# Patient Record
Sex: Female | Born: 1960 | Race: White | Hispanic: No | Smoking: Never smoker
Health system: Southern US, Community
[De-identification: ages and names within clinical notes are randomized; demographics above are authoritative.]

## PROBLEM LIST (undated history)

## (undated) DIAGNOSIS — E119 Type 2 diabetes mellitus without complications: Secondary | ICD-10-CM

## (undated) DIAGNOSIS — K219 Gastro-esophageal reflux disease without esophagitis: Secondary | ICD-10-CM

## (undated) DIAGNOSIS — K649 Unspecified hemorrhoids: Secondary | ICD-10-CM

## (undated) DIAGNOSIS — K589 Irritable bowel syndrome without diarrhea: Secondary | ICD-10-CM

## (undated) DIAGNOSIS — F419 Anxiety disorder, unspecified: Secondary | ICD-10-CM

## (undated) DIAGNOSIS — T7840XA Allergy, unspecified, initial encounter: Secondary | ICD-10-CM

## (undated) DIAGNOSIS — D580 Hereditary spherocytosis: Secondary | ICD-10-CM

## (undated) DIAGNOSIS — K449 Diaphragmatic hernia without obstruction or gangrene: Secondary | ICD-10-CM

## (undated) HISTORY — DX: Irritable bowel syndrome, unspecified: K58.9

## (undated) HISTORY — DX: Type 2 diabetes mellitus without complications: E11.9

## (undated) HISTORY — PX: HERNIA REPAIR: SHX51

## (undated) HISTORY — DX: Gastro-esophageal reflux disease without esophagitis: K21.9

## (undated) HISTORY — DX: Unspecified hemorrhoids: K64.9

## (undated) HISTORY — DX: Diaphragmatic hernia without obstruction or gangrene: K44.9

## (undated) HISTORY — DX: Anxiety disorder, unspecified: F41.9

## (undated) HISTORY — PX: TUBAL LIGATION: SHX77

## (undated) HISTORY — DX: Hereditary spherocytosis: D58.0

## (undated) HISTORY — PX: COSMETIC SURGERY: SHX468

## (undated) HISTORY — DX: Allergy, unspecified, initial encounter: T78.40XA

---

## 1997-05-25 HISTORY — PX: TUBAL LIGATION: SHX77

## 1998-01-31 ENCOUNTER — Ambulatory Visit (HOSPITAL_COMMUNITY): Admission: RE | Admit: 1998-01-31 | Discharge: 1998-01-31 | Payer: Self-pay | Admitting: Obstetrics and Gynecology

## 1998-03-25 ENCOUNTER — Encounter: Payer: Self-pay | Admitting: *Deleted

## 1998-03-25 ENCOUNTER — Ambulatory Visit (HOSPITAL_COMMUNITY): Admission: RE | Admit: 1998-03-25 | Discharge: 1998-03-25 | Payer: Self-pay | Admitting: *Deleted

## 1998-04-23 ENCOUNTER — Inpatient Hospital Stay (HOSPITAL_COMMUNITY): Admission: AD | Admit: 1998-04-23 | Discharge: 1998-04-23 | Payer: Self-pay | Admitting: Obstetrics and Gynecology

## 1998-04-24 ENCOUNTER — Inpatient Hospital Stay (HOSPITAL_COMMUNITY): Admission: AD | Admit: 1998-04-24 | Discharge: 1998-04-24 | Payer: Self-pay | Admitting: Obstetrics and Gynecology

## 1998-04-25 ENCOUNTER — Inpatient Hospital Stay (HOSPITAL_COMMUNITY): Admission: AD | Admit: 1998-04-25 | Discharge: 1998-04-25 | Payer: Self-pay | Admitting: *Deleted

## 1998-05-03 ENCOUNTER — Inpatient Hospital Stay (HOSPITAL_COMMUNITY): Admission: RE | Admit: 1998-05-03 | Discharge: 1998-05-03 | Payer: Self-pay | Admitting: *Deleted

## 1998-05-07 ENCOUNTER — Encounter (HOSPITAL_COMMUNITY): Admission: RE | Admit: 1998-05-07 | Discharge: 1998-05-17 | Payer: Self-pay | Admitting: Obstetrics and Gynecology

## 1998-05-16 ENCOUNTER — Inpatient Hospital Stay (HOSPITAL_COMMUNITY): Admission: AD | Admit: 1998-05-16 | Discharge: 1998-05-19 | Payer: Self-pay | Admitting: Obstetrics and Gynecology

## 1998-05-20 ENCOUNTER — Encounter (HOSPITAL_COMMUNITY): Admission: RE | Admit: 1998-05-20 | Discharge: 1998-08-18 | Payer: Self-pay | Admitting: *Deleted

## 1999-08-20 ENCOUNTER — Other Ambulatory Visit: Admission: RE | Admit: 1999-08-20 | Discharge: 1999-08-20 | Payer: Self-pay | Admitting: Family Medicine

## 1999-09-11 ENCOUNTER — Encounter: Admission: RE | Admit: 1999-09-11 | Discharge: 1999-09-11 | Payer: Self-pay | Admitting: Family Medicine

## 1999-09-11 ENCOUNTER — Encounter: Payer: Self-pay | Admitting: Family Medicine

## 2000-02-13 ENCOUNTER — Encounter: Payer: Self-pay | Admitting: Family Medicine

## 2000-02-13 ENCOUNTER — Encounter: Admission: RE | Admit: 2000-02-13 | Discharge: 2000-02-13 | Payer: Self-pay | Admitting: Family Medicine

## 2000-12-15 ENCOUNTER — Other Ambulatory Visit: Admission: RE | Admit: 2000-12-15 | Discharge: 2000-12-15 | Payer: Self-pay | Admitting: Obstetrics and Gynecology

## 2000-12-15 ENCOUNTER — Ambulatory Visit (HOSPITAL_COMMUNITY): Admission: RE | Admit: 2000-12-15 | Discharge: 2000-12-15 | Payer: Self-pay | Admitting: Obstetrics and Gynecology

## 2000-12-15 ENCOUNTER — Encounter: Payer: Self-pay | Admitting: Obstetrics and Gynecology

## 2001-03-14 ENCOUNTER — Ambulatory Visit (HOSPITAL_COMMUNITY): Admission: RE | Admit: 2001-03-14 | Discharge: 2001-03-14 | Payer: Self-pay | Admitting: Family Medicine

## 2001-03-14 ENCOUNTER — Encounter: Payer: Self-pay | Admitting: Family Medicine

## 2002-03-07 ENCOUNTER — Other Ambulatory Visit: Admission: RE | Admit: 2002-03-07 | Discharge: 2002-03-07 | Payer: Self-pay | Admitting: Obstetrics and Gynecology

## 2002-03-09 ENCOUNTER — Ambulatory Visit (HOSPITAL_COMMUNITY): Admission: RE | Admit: 2002-03-09 | Discharge: 2002-03-09 | Payer: Self-pay | Admitting: Obstetrics and Gynecology

## 2002-03-09 ENCOUNTER — Encounter: Payer: Self-pay | Admitting: Obstetrics and Gynecology

## 2003-03-14 ENCOUNTER — Encounter: Admission: RE | Admit: 2003-03-14 | Discharge: 2003-03-14 | Payer: Self-pay | Admitting: Family Medicine

## 2003-03-14 ENCOUNTER — Encounter: Payer: Self-pay | Admitting: Family Medicine

## 2003-03-21 ENCOUNTER — Encounter: Admission: RE | Admit: 2003-03-21 | Discharge: 2003-03-21 | Payer: Self-pay | Admitting: Family Medicine

## 2003-04-04 ENCOUNTER — Other Ambulatory Visit: Admission: RE | Admit: 2003-04-04 | Discharge: 2003-04-04 | Payer: Self-pay | Admitting: Obstetrics and Gynecology

## 2003-04-16 ENCOUNTER — Ambulatory Visit (HOSPITAL_COMMUNITY): Admission: RE | Admit: 2003-04-16 | Discharge: 2003-04-16 | Payer: Self-pay | Admitting: Obstetrics and Gynecology

## 2004-01-07 ENCOUNTER — Encounter: Admission: RE | Admit: 2004-01-07 | Discharge: 2004-01-07 | Payer: Self-pay | Admitting: Family Medicine

## 2004-02-27 ENCOUNTER — Ambulatory Visit: Payer: Self-pay | Admitting: Specialist

## 2004-05-27 ENCOUNTER — Ambulatory Visit (HOSPITAL_COMMUNITY): Admission: RE | Admit: 2004-05-27 | Discharge: 2004-05-27 | Payer: Self-pay | Admitting: Family Medicine

## 2004-06-23 ENCOUNTER — Ambulatory Visit: Payer: Self-pay | Admitting: Family Medicine

## 2004-06-23 ENCOUNTER — Other Ambulatory Visit: Admission: RE | Admit: 2004-06-23 | Discharge: 2004-06-23 | Payer: Self-pay | Admitting: Family Medicine

## 2004-06-26 ENCOUNTER — Ambulatory Visit: Payer: Self-pay | Admitting: Family Medicine

## 2004-08-20 ENCOUNTER — Ambulatory Visit: Payer: Self-pay | Admitting: Family Medicine

## 2004-11-10 ENCOUNTER — Ambulatory Visit: Payer: Self-pay | Admitting: Family Medicine

## 2004-11-13 ENCOUNTER — Encounter: Admission: RE | Admit: 2004-11-13 | Discharge: 2004-11-13 | Payer: Self-pay | Admitting: Family Medicine

## 2004-11-21 ENCOUNTER — Ambulatory Visit: Payer: Self-pay | Admitting: Family Medicine

## 2005-01-19 ENCOUNTER — Ambulatory Visit: Payer: Self-pay | Admitting: Family Medicine

## 2005-01-19 ENCOUNTER — Encounter: Admission: RE | Admit: 2005-01-19 | Discharge: 2005-01-19 | Payer: Self-pay | Admitting: Family Medicine

## 2005-09-17 ENCOUNTER — Ambulatory Visit (HOSPITAL_COMMUNITY): Admission: RE | Admit: 2005-09-17 | Discharge: 2005-09-17 | Payer: Self-pay | Admitting: Family Medicine

## 2005-10-12 ENCOUNTER — Encounter: Admission: RE | Admit: 2005-10-12 | Discharge: 2005-10-12 | Payer: Self-pay | Admitting: Family Medicine

## 2006-01-06 ENCOUNTER — Other Ambulatory Visit: Admission: RE | Admit: 2006-01-06 | Discharge: 2006-01-06 | Payer: Self-pay | Admitting: Family Medicine

## 2006-01-06 ENCOUNTER — Ambulatory Visit: Payer: Self-pay | Admitting: Family Medicine

## 2006-01-06 ENCOUNTER — Encounter: Payer: Self-pay | Admitting: Family Medicine

## 2006-01-06 LAB — CONVERTED CEMR LAB: Pap Smear: NORMAL

## 2006-03-01 ENCOUNTER — Ambulatory Visit: Payer: Self-pay | Admitting: Family Medicine

## 2006-04-07 ENCOUNTER — Ambulatory Visit: Payer: Self-pay | Admitting: Family Medicine

## 2006-07-02 ENCOUNTER — Ambulatory Visit: Payer: Self-pay | Admitting: Family Medicine

## 2007-02-23 ENCOUNTER — Ambulatory Visit (HOSPITAL_COMMUNITY): Admission: RE | Admit: 2007-02-23 | Discharge: 2007-02-23 | Payer: Self-pay | Admitting: Family Medicine

## 2007-02-25 ENCOUNTER — Encounter (INDEPENDENT_AMBULATORY_CARE_PROVIDER_SITE_OTHER): Payer: Self-pay | Admitting: *Deleted

## 2007-04-01 ENCOUNTER — Ambulatory Visit: Payer: Self-pay | Admitting: Family Medicine

## 2007-07-26 DIAGNOSIS — N3000 Acute cystitis without hematuria: Secondary | ICD-10-CM | POA: Insufficient documentation

## 2007-07-27 ENCOUNTER — Ambulatory Visit: Payer: Self-pay | Admitting: Internal Medicine

## 2007-07-27 DIAGNOSIS — F411 Generalized anxiety disorder: Secondary | ICD-10-CM | POA: Insufficient documentation

## 2007-07-27 DIAGNOSIS — J309 Allergic rhinitis, unspecified: Secondary | ICD-10-CM | POA: Insufficient documentation

## 2007-07-27 LAB — CONVERTED CEMR LAB
Bilirubin Urine: NEGATIVE
Blood in Urine, dipstick: NEGATIVE
Glucose, Urine, Semiquant: NEGATIVE
Ketones, urine, test strip: NEGATIVE
Nitrite: NEGATIVE
Protein, U semiquant: 100
Specific Gravity, Urine: >=1.03
Urobilinogen, UA: 0.2
WBC Urine, dipstick: NEGATIVE
pH: 5

## 2007-08-10 ENCOUNTER — Ambulatory Visit: Payer: Self-pay | Admitting: Family Medicine

## 2007-08-10 DIAGNOSIS — R21 Rash and other nonspecific skin eruption: Secondary | ICD-10-CM | POA: Insufficient documentation

## 2007-08-10 LAB — CONVERTED CEMR LAB: KOH Prep: NEGATIVE

## 2007-08-12 ENCOUNTER — Encounter: Payer: Self-pay | Admitting: Family Medicine

## 2007-08-19 ENCOUNTER — Telehealth: Payer: Self-pay | Admitting: Family Medicine

## 2008-03-22 ENCOUNTER — Ambulatory Visit: Payer: Self-pay | Admitting: Family Medicine

## 2008-08-05 ENCOUNTER — Emergency Department (HOSPITAL_COMMUNITY): Admission: EM | Admit: 2008-08-05 | Discharge: 2008-08-05 | Payer: Self-pay | Admitting: Family Medicine

## 2008-11-21 ENCOUNTER — Ambulatory Visit: Payer: Self-pay | Admitting: Family Medicine

## 2008-11-21 DIAGNOSIS — R5381 Other malaise: Secondary | ICD-10-CM | POA: Insufficient documentation

## 2008-11-21 DIAGNOSIS — R5383 Other fatigue: Secondary | ICD-10-CM

## 2008-11-23 LAB — CONVERTED CEMR LAB
ALT: 17 units/L (ref 0–35)
AST: 19 units/L (ref 0–37)
Albumin: 4 g/dL (ref 3.5–5.2)
Alkaline Phosphatase: 55 units/L (ref 39–117)
BUN: 14 mg/dL (ref 6–23)
Basophils Absolute: 0.1 10*3/uL (ref 0.0–0.1)
Basophils Relative: 0.9 % (ref 0.0–3.0)
Bilirubin, Direct: 0 mg/dL (ref 0.0–0.3)
CO2: 30 meq/L (ref 19–32)
Calcium: 9 mg/dL (ref 8.4–10.5)
Chloride: 106 meq/L (ref 96–112)
Cholesterol: 180 mg/dL (ref 0–200)
Creatinine, Ser: 0.7 mg/dL (ref 0.4–1.2)
Eosinophils Absolute: 0.1 10*3/uL (ref 0.0–0.7)
Eosinophils Relative: 1.6 % (ref 0.0–5.0)
GFR calc non Af Amer: 94.52 mL/min (ref >60)
Glucose, Bld: 92 mg/dL (ref 70–99)
HCT: 36.2 % (ref 36.0–46.0)
HDL: 68.4 mg/dL (ref >39.00)
Hemoglobin: 12.3 g/dL (ref 12.0–15.0)
LDL Cholesterol: 98 mg/dL (ref 0–99)
Lymphocytes Relative: 34.4 % (ref 12.0–46.0)
Lymphs Abs: 2 10*3/uL (ref 0.7–4.0)
MCHC: 34 g/dL (ref 30.0–36.0)
MCV: 91.1 fL (ref 78.0–100.0)
Monocytes Absolute: 0.5 10*3/uL (ref 0.1–1.0)
Monocytes Relative: 7.8 % (ref 3.0–12.0)
Neutro Abs: 3.1 10*3/uL (ref 1.4–7.7)
Neutrophils Relative %: 55.3 % (ref 43.0–77.0)
Platelets: 202 10*3/uL (ref 150.0–400.0)
Potassium: 4 meq/L (ref 3.5–5.1)
RBC: 3.98 M/uL (ref 3.87–5.11)
RDW: 12.5 % (ref 11.5–14.6)
Sodium: 142 meq/L (ref 135–145)
TSH: 2.7 microintl units/mL (ref 0.35–5.50)
Total Bilirubin: 0.7 mg/dL (ref 0.3–1.2)
Total CHOL/HDL Ratio: 3
Total Protein: 7.2 g/dL (ref 6.0–8.3)
Triglycerides: 70 mg/dL (ref 0.0–149.0)
VLDL: 14 mg/dL (ref 0.0–40.0)
WBC: 5.8 10*3/uL (ref 4.5–10.5)

## 2009-01-09 ENCOUNTER — Encounter: Payer: Self-pay | Admitting: Family Medicine

## 2009-01-09 ENCOUNTER — Other Ambulatory Visit: Admission: RE | Admit: 2009-01-09 | Discharge: 2009-01-09 | Payer: Self-pay | Admitting: Family Medicine

## 2009-01-09 ENCOUNTER — Ambulatory Visit: Payer: Self-pay | Admitting: Family Medicine

## 2009-01-14 ENCOUNTER — Encounter (INDEPENDENT_AMBULATORY_CARE_PROVIDER_SITE_OTHER): Payer: Self-pay | Admitting: *Deleted

## 2009-02-06 ENCOUNTER — Ambulatory Visit (HOSPITAL_COMMUNITY): Admission: RE | Admit: 2009-02-06 | Discharge: 2009-02-06 | Payer: Self-pay | Admitting: Family Medicine

## 2009-02-12 ENCOUNTER — Encounter (INDEPENDENT_AMBULATORY_CARE_PROVIDER_SITE_OTHER): Payer: Self-pay | Admitting: *Deleted

## 2009-03-06 ENCOUNTER — Ambulatory Visit: Payer: Self-pay | Admitting: Family Medicine

## 2010-02-28 ENCOUNTER — Ambulatory Visit (HOSPITAL_COMMUNITY): Admission: RE | Admit: 2010-02-28 | Discharge: 2010-02-28 | Payer: Self-pay | Admitting: Family Medicine

## 2010-02-28 LAB — HM MAMMOGRAPHY

## 2010-03-28 ENCOUNTER — Telehealth (INDEPENDENT_AMBULATORY_CARE_PROVIDER_SITE_OTHER): Payer: Self-pay | Admitting: *Deleted

## 2010-03-28 ENCOUNTER — Ambulatory Visit: Payer: Self-pay | Admitting: Family Medicine

## 2010-03-28 LAB — CONVERTED CEMR LAB
ALT: 14 units/L (ref 0–35)
AST: 17 units/L (ref 0–37)
Albumin: 3.9 g/dL (ref 3.5–5.2)
Alkaline Phosphatase: 59 units/L (ref 39–117)
BUN: 12 mg/dL (ref 6–23)
Bilirubin, Direct: 0 mg/dL (ref 0.0–0.3)
CO2: 32 meq/L (ref 19–32)
Calcium: 9.3 mg/dL (ref 8.4–10.5)
Chloride: 107 meq/L (ref 96–112)
Cholesterol: 161 mg/dL (ref 0–200)
Creatinine, Ser: 0.7 mg/dL (ref 0.4–1.2)
GFR calc non Af Amer: 95.57 mL/min (ref >60)
Glucose, Bld: 104 mg/dL — ABNORMAL HIGH (ref 70–99)
HDL: 61.9 mg/dL (ref >39.00)
LDL Cholesterol: 89 mg/dL (ref 0–99)
Potassium: 4.5 meq/L (ref 3.5–5.1)
Sodium: 142 meq/L (ref 135–145)
Total Bilirubin: 0.4 mg/dL (ref 0.3–1.2)
Total CHOL/HDL Ratio: 3
Total Protein: 6.4 g/dL (ref 6.0–8.3)
Triglycerides: 53 mg/dL (ref 0.0–149.0)
VLDL: 10.6 mg/dL (ref 0.0–40.0)

## 2010-04-04 ENCOUNTER — Other Ambulatory Visit: Admission: RE | Admit: 2010-04-04 | Discharge: 2010-04-04 | Payer: Self-pay | Admitting: Family Medicine

## 2010-04-04 ENCOUNTER — Ambulatory Visit: Payer: Self-pay | Admitting: Family Medicine

## 2010-04-04 LAB — HM PAP SMEAR

## 2010-04-07 ENCOUNTER — Encounter: Payer: Self-pay | Admitting: Internal Medicine

## 2010-04-11 ENCOUNTER — Encounter: Payer: Self-pay | Admitting: Family Medicine

## 2010-04-11 LAB — CONVERTED CEMR LAB: Pap Smear: NEGATIVE

## 2010-06-05 ENCOUNTER — Encounter (INDEPENDENT_AMBULATORY_CARE_PROVIDER_SITE_OTHER): Payer: Self-pay | Admitting: *Deleted

## 2010-06-06 ENCOUNTER — Ambulatory Visit
Admission: RE | Admit: 2010-06-06 | Discharge: 2010-06-06 | Payer: Self-pay | Source: Home / Self Care | Attending: Internal Medicine | Admitting: Internal Medicine

## 2010-06-20 ENCOUNTER — Ambulatory Visit
Admission: RE | Admit: 2010-06-20 | Discharge: 2010-06-20 | Payer: Self-pay | Source: Home / Self Care | Attending: Internal Medicine | Admitting: Internal Medicine

## 2010-06-20 ENCOUNTER — Encounter: Payer: Self-pay | Admitting: Internal Medicine

## 2010-06-24 NOTE — Assessment & Plan Note (Signed)
Summary: CPX W/PAP/RBH   Vital Signs:  Patient profile:   50 year old female Height:      62 inches Weight:      129.0 pounds BMI:     23.68 Temp:     97.8 degrees F oral Pulse rate:   80 / minute Pulse rhythm:   regular BP sitting:   110 / 70  (left arm) Cuff size:   regular  Vitals Entered By: Benny Lennert CMA Duncan Dull) (April 04, 2010 3:50 PM)  History of Present Illness: Chief complaint cpx with pap  The patient is here for annual wellness exam and preventative care.     Minimal exercise..stretching 3-4 days a week. Working on  healthy foods...still eats some fast food.   Problems Prior to Update: 1)  Routine Gynecological Examination  (ICD-V72.31) 2)  Physical Examination  (ICD-V70.0) 3)  Other Screening Mammogram  (ICD-V76.12) 4)  Screening For Lipoid Disorders  (ICD-V77.91) 5)  Other Malaise and Fatigue  (ICD-780.79) 6)  Skin Rash  (ICD-782.1) 7)  Anxiety  (ICD-300.00) 8)  Allergic Rhinitis  (ICD-477.9) 9)  Acute Cystitis  (ICD-595.0)  Current Medications (verified): 1)  Caltrate 600+d Plus 600-400 Mg-Unit Tabs (Calcium Carbonate-Vit D-Min) .... Oen Tablet Daily 2)  Famotidine 10 Mg Tabs (Famotidine) .Marland Kitchen.. 1 Tab By Mouth Daily As Needed Ger  Allergies: 1)  ! * Z Pak 2)  ! Zoloft 3)  ! Lexapro  Past History:  Past medical, surgical, family and social histories (including risk factors) reviewed, and no changes noted (except as noted below).  Past Medical History: Reviewed history from 07/27/2007 and no changes required. Allergic rhinitis Anxiety/ Panic disorder IBS Hemorrhoids Hiatal hernia ? hereditary spherocytosis  Past Surgical History: Reviewed history from 07/27/2007 and no changes required. Caesarean section- 1999 Tubal ligation-1999, after delivering twins Giardia infection- 1989 Hemolytic anemia EGD- Hiatal Hernia- 1993 Abdominal US- 1993 Lithotripsy, kidney stone- 2004 CT of head, negative- 08/ 2005 Ascus pap- 03/ 2001 Dexa,  normal- 02/ 2006 Pelvic US, tiny fibroid- 06/ 2006 Endo, negative- 07/ 2006  Family History: Reviewed history from 07/27/2007 and no changes required. + HTN + DM- mother & sister No hx of any type cancer or heart disease.  Social History: Reviewed history from 01/09/2009 and no changes required. Married  Review of Systems General:  Denies fatigue and fever. CV:  Denies chest pain or discomfort. Resp:  Denies shortness of breath. GI:  Denies abdominal pain, bloody stools, constipation, and diarrhea; occ indigestion. GU:  Denies abnormal vaginal bleeding, dysuria, genital sores, and hematuria. Psych:  Denies anxiety and depression.  Physical Exam  General:  Well-developed,well-nourished,in no acute distress; alert,appropriate and cooperative throughout examination Eyes:  No corneal or conjunctival inflammation noted. EOMI. Perrla. Funduscopic exam benign, without hemorrhages, exudates or papilledema. Vision grossly normal. Ears:  External ear exam shows no significant lesions or deformities.  Otoscopic examination reveals clear canals, tympanic membranes are intact bilaterally without bulging, retraction, inflammation or discharge. Hearing is grossly normal bilaterally. Nose:  External nasal examination shows no deformity or inflammation. Nasal mucosa are pink and moist without lesions or exudates. Mouth:  Oral mucosa and oropharynx without lesions or exudates.  Teeth in good repair. Neck:  no carotid bruit or thyromegaly no cervical or supraclavicular lymphadenopathy  Chest Wall:  No deformities, masses, or tenderness noted. Breasts:  No mass, nodules, thickening, tenderness, bulging, retraction, inflamation, nipple discharge or skin changes noted.   Lungs:  Normal respiratory effort, chest expands symmetrically. Lungs are clear to auscultation,  no crackles or wheezes. Heart:  Normal rate and regular rhythm. S1 and S2 normal without gallop, murmur, click, rub or other extra  sounds. Abdomen:  Bowel sounds positive,abdomen soft and non-tender without masses, organomegaly or hernias noted. Genitalia:  Pelvic Exam:        External: normal female genitalia without lesions or masses        Vagina: normal without lesions or masses        Cervix: normal without lesions or masses        Adnexa: normal bimanual exam without masses or fullness        Uterus: normal by palpation        Pap smear: performed Msk:  No deformity or scoliosis noted of thoracic or lumbar spine.   Pulses:  R and L posterior tibial pulses are full and equal bilaterally  Extremities:  no edema Skin:  Intact without suspicious lesions or rashes Psych:  Cognition and judgment appear intact. Alert and cooperative with normal attention span and concentration. No apparent delusions, illusions, hallucinations   Impression & Recommendations:  Problem # 1:  PHYSICAL EXAMINATION (ICD-V70.0) The patient's preventative maintenance and recommended screening tests for an annual wellness exam were reviewed in full today. Brought up to date unless services declined.  Counselled on the importance of diet, exercise, and its role in overall health and mortality. The patient's FH and SH was reviewed, including their home life, tobacco status, and drug and alcohol status.     Problem # 2:  ROUTINE GYNECOLOGICAL EXAMINATION (ICD-V72.31) PAP pending.   Problem # 3:  SCREENING, COLON CANCER (ICD-V76.51)  Orders: Gastroenterology Referral (GI)  Complete Medication List: 1)  Caltrate 600+d Plus 600-400 Mg-unit Tabs (Calcium carbonate-vit d-min) .... Oen tablet daily 2)  Famotidine 10 Mg Tabs (Famotidine) .Marland Kitchen.. 1 tab by mouth daily as needed ger  Other Orders: Tdap => 49yrs IM (30865) Admin 1st Vaccine (78469)  Patient Instructions: 1)  Work on starting regular exercsie routine...3-5 days a week. 2)   Limit fast foods.  Decrease sweets, carbs in diet.Marland Kitchenless bread, pasta, potatos. 3)  Referral Appointment  Information 4)  Day/Date: 5)  Time: 6)  Place/MD: 7)  Address: 8)  Phone/Fax: 9)  Patient given appointment information. Information/Orders faxed/mailed.    Orders Added: 1)  Tdap => 28yrs IM [90715] 2)  Admin 1st Vaccine [90471] 3)  Gastroenterology Referral [GI] 4)  Est. Patient 40-64 years [99396]   Immunizations Administered:  Tetanus Vaccine:    Vaccine Type: Tdap    Site: right deltoid    Mfr: GlaxoSmithKline    Dose: 0.5 ml    Route: IM    Given by: Benny Lennert CMA (AAMA)    Exp. Date: 03/13/2012    Lot #: [id number]    VIS given: 04/11/08 version given April 07, 2010.   Immunizations Administered:  Tetanus Vaccine:    Vaccine Type: Tdap    Site: right deltoid    Mfr: GlaxoSmithKline    Dose: 0.5 ml    Route: IM    Given by: Benny Lennert CMA (AAMA)    Exp. Date: 03/13/2012    Lot #: [id number]    VIS given: 04/11/08 version given April 07, 2010.  Current Allergies (reviewed today): ! * Z PAK ! ZOLOFT ! LEXAPRO  Last Flu Vaccine:  Fluvax 3+ (03/06/2009 9:13:11 AM) Flu Vaccine Result Date:  04/11/2010 Flu Vaccine Result:  given Flu Vaccine Next Due:  1 yr Flex Sig Next Due:  Not Indicated Hemoccult  Next Due:  Not Indicated

## 2010-06-24 NOTE — Letter (Signed)
Summary: Results Follow up Letter  [name] at [facility]  [address] [place], [place] [address]   Phone: [phone]  Fax: [phone]    04/11/2010 MRN: [id number]  Natalie Hudson [address] [place], [place]  [address]  Dear [name],  The following are the results of your recent test(s):  Test         Result    Pap Smear:        Normal __X___  Not Normal _____ Comments: ______________________________________________________ Cholesterol: LDL(Bad cholesterol):         Your goal is less than:         HDL (Good cholesterol):       Your goal is more than: Comments:  ______________________________________________________ Mammogram:        Normal _____  Not Normal _____ Comments:  ___________________________________________________________________ Hemoccult:        Normal _____  Not normal _______ Comments:    _____________________________________________________________________ Other Tests:    We routinely do not discuss normal results over the telephone.  If you desire a copy of the results, or you have any questions about this information we can discuss them at your next office visit.   Sincerely,     Dr. Ruthe Mannan

## 2010-06-24 NOTE — Progress Notes (Signed)
----   Converted from flag ---- ---- 03/28/2010 8:48 AM, Kerby Nora MD wrote: CMET, lipids Dx v77.91  ---- 03/27/2010 7:28 AM, Liane Comber CMA (AAMA) wrote: Lab orders please! Good Morning! This pt is scheduled for cpx labs tomorrow, which labs to draw and dx codes to use? Thanks Tasha ------------------------------

## 2010-06-24 NOTE — Letter (Signed)
Summary: Results Follow up Letter  [place] at [facility]  [address] [place], [place] [address]   Phone: [phone]  Fax: [phone]    04/11/2010 MRN: [id number]  Natalie Hudson [address] [place], [place]  [address]  Dear Ms. Hurless,  The following are the results of your recent test(s):  Test         Result    Pap Smear:        Normal __X___  Not Normal _____ Comments: ______________________________________________________ Cholesterol: LDL(Bad cholesterol):         Your goal is less than:         HDL (Good cholesterol):       Your goal is more than: Comments:  ______________________________________________________ Mammogram:        Normal _____  Not Normal _____ Comments:  ___________________________________________________________________ Hemoccult:        Normal _____  Not normal _______ Comments:    _____________________________________________________________________ Other Tests:    We routinely do not discuss normal results over the telephone.  If you desire a copy of the results, or you have any questions about this information we can discuss them at your next office visit.   Sincerely,     Dr. Karleen Hampshire Copland

## 2010-06-24 NOTE — Letter (Signed)
Summary: Pre Visit Letter Revised  [facility]  [address] [place], [place] [address]   Phone: [phone]  Fax: [phone]        04/07/2010 MRN: [id number]  Natalie Hudson [address] [place], [place]  [address]             Procedure Date:  06-20-10  8:30am           Dr Lina Sar   Welcome to the Gastroenterology Division at [facility].    You are scheduled to see a nurse for your pre-procedure visit on 06-06-2010 at 4:30pm on the 3rd floor at [facility], [address]  We ask that you try to arrive at our office 15 minutes prior to your appointment time to allow for check-in.  Please take a minute to review the attached form.  If you answer "Yes" to one or more of the questions on the first page, we ask that you call the person listed at your earliest opportunity.  If you answer "No" to all of the questions, please complete the rest of the form and bring it to your appointment.    Your nurse visit will consist of discussing your medical and surgical history, your immediate family medical history, and your medications.   If you are unable to list all of your medications on the form, please bring the medication bottles to your appointment and we will list them.  We will need to be aware of both prescribed and over the counter drugs.  We will need to know exact dosage information as well.    Please be prepared to read and sign documents such as consent forms, a financial agreement, and acknowledgement forms.  If necessary, and with your consent, a friend or relative is welcome to sit-in on the nurse visit with you.  Please bring your insurance card so that we may make a copy of it.  If your insurance requires a referral to see a specialist, please bring your referral form from your primary care physician.  No co-pay is required for this nurse visit.     If you cannot keep your appointment, please call [phone] to cancel or reschedule prior  to your appointment date.  This allows [place] the opportunity to schedule an appointment for another patient in need of care.    Thank you for choosing [facility] for your medical needs.  We appreciate the opportunity to care for you.  Please visit [place] at our website  to learn more about our practice.  Sincerely, The Gastroenterology Division

## 2010-06-26 NOTE — Letter (Signed)
Summary: [facility] Instructions  [facility]  [address] [place], [place] [address]   Phone: [phone]  Fax: [phone]       Natalie Hudson    January 25, 1961    MRN: [id number]       Procedure Day /Date:  Friday 06/20/2010     Arrival Time: 7:30 am     Procedure Time: 8:30 am     Location of Procedure:                    _x _  [facility] (4th Floor)    PREPARATION FOR COLONOSCOPY WITH MIRALAX  Starting 5 days prior to your procedure Sunday 1/22  do not eat nuts, seeds, popcorn, corn, beans, peas,  salads, or any raw vegetables.  Do not take any fiber supplements (e.g. Metamucil, Citrucel, and Benefiber). ____________________________________________________________________________________________________   THE DAY BEFORE YOUR PROCEDURE         DATE: Thursday 1/26  1   Drink clear liquids the entire day-NO SOLID FOOD  2   Do not drink anything colored red or purple.  Avoid juices with pulp.  No orange juice.  3   Drink at least 64 oz. (8 glasses) of fluid/clear liquids during the day to prevent dehydration and help the prep work efficiently.  CLEAR LIQUIDS INCLUDE: Water Jello Ice Popsicles Tea (sugar ok, no milk/cream) Powdered fruit flavored drinks Coffee (sugar ok, no milk/cream) Gatorade Juice: apple, white grape, white cranberry  Lemonade Clear bullion, consomm, broth Carbonated beverages (any kind) Strained chicken noodle soup Hard Candy  4   Mix the entire bottle of Miralax with 64 oz. of Gatorade/Powerade in the morning and put in the refrigerator to chill.  5   At 3:00 pm take 2 Dulcolax/Bisacodyl tablets.  6   At 4:30 pm take one Reglan/Metoclopramide tablet.  7  Starting at 5:00 pm drink one 8 oz glass of the Miralax mixture every 15-20 minutes until you have finished drinking the entire 64 oz.  You should finish drinking prep around 7:30 or 8:00 pm.  8   If you are nauseated, you may take the 2nd Reglan/Metoclopramide  tablet at 6:30 pm.        9    At 8:00 pm take 2 more DULCOLAX/Bisacodyl tablets.     THE DAY OF YOUR PROCEDURE      DATE:  Friday 1/27  You may drink clear liquids until 6:30 am  (2 HOURS BEFORE PROCEDURE).   MEDICATION INSTRUCTIONS  Unless otherwise instructed, you should take regular prescription medications with a small sip of water as early as possible the morning of your procedure.           OTHER INSTRUCTIONS  You will need a responsible adult at least 51 years of age to accompany you and drive you home.   This person must remain in the waiting room during your procedure.  Wear loose fitting clothing that is easily removed.  Leave jewelry and other valuables at home.  However, you may wish to bring a book to read or an iPod/MP3 player to listen to music as you wait for your procedure to start.  Remove all body piercing jewelry and leave at home.  Total time from sign-in until discharge is approximately 2-3 hours.  You should go home directly after your procedure and rest.  You can resume normal activities the day after your procedure.  The day of your procedure you should not:   Drive  Make legal decisions   Operate machinery   Drink alcohol   Return to work  You will receive specific instructions about eating, activities and medications before you leave.   The above instructions have been reviewed and explained to me by   Sherren Kerns RN  June 06, 2010 4:49 PM    I fully understand and can verbalize these instructions _____________________________ Date _______

## 2010-06-26 NOTE — Procedures (Signed)
Summary: Colonoscopy  Patient: [name] Note: All result statuses are Final unless otherwise noted.  Tests: (1) Colonoscopy (COL)   COL Colonoscopy           DONE     [facility]     [address]     [place], [place]  [address]           COLONOSCOPY PROCEDURE REPORT           PATIENT:  Natalie Hudson, Natalie Hudson  MR#:  [id number]     BIRTHDATE:  1960/08/21, 49 yrs. old  GENDER:  female     ENDOSCOPIST:  Hedwig Morton. Juanda Chance, MD     REF. BY:  Excell Seltzer, M.D.     PROCEDURE DATE:  06/20/2010     PROCEDURE:  Colonoscopy [address]     ASA CLASS:  Class I     INDICATIONS:  colorectal cancer screening, average risk     MEDICATIONS:   Versed 5 mg, Fentanyl 50 mcg           DESCRIPTION OF PROCEDURE:   After the risks benefits and     alternatives of the procedure were thoroughly explained, informed     consent was obtained.  Digital rectal exam was performed and     revealed no rectal masses.   The LB [id number] endoscope     was introduced through the anus and advanced to the cecum, which     was identified by both the appendix and ileocecal valve, without     limitations.  The quality of the prep was excellent, using     MiraLax.  The instrument was then slowly withdrawn as the colon     was fully examined.     <<PROCEDUREIMAGES>>           FINDINGS:  No polyps or cancers were seen (see image1, image2,     image3, image4, image5, and image6).   Retroflexed views in the     rectum revealed no abnormalities.    The scope was then withdrawn     from the patient and the procedure completed.           COMPLICATIONS:  None     ENDOSCOPIC IMPRESSION:     1) No polyps or cancers     2) Normal colonoscopy     RECOMMENDATIONS:     1) high fiber diet     REPEAT EXAM:  In 10 year(s) for.           ______________________________     Hedwig Morton. Juanda Chance, MD           CC:           n.     eSIGNED:   Hedwig Morton. Detra Bores at 06/20/2010 09:04 AM           Audelia Hives, [id number]  Note:  An exclamation mark (!) indicates a result that was not dispersed into the flowsheet. Document Creation Date: 06/20/2010 9:05 AM _______________________________________________________________________  (1) Order result status: Final Collection or observation date-time: 06/20/2010 08:57 Requested date-time:  Receipt date-time:  Reported date-time:  Referring Physician:   Ordering Physician: Lina Sar [id number]) Specimen Source:  Source: Launa Grill Order Number: [id number] Lab site:   Appended Document: Colonoscopy    Clinical Lists Changes  Observations: Added new observation of COLONNXTDUE: 05/2020 (06/20/2010 13:15)

## 2010-06-26 NOTE — Miscellaneous (Signed)
Summary: previsit prep/rm  Clinical Lists Changes  Medications: Added new medication of MIRALAX   POWD (POLYETHYLENE GLYCOL 3350) As per prep  instructions. - Signed Added new medication of DULCOLAX 5 MG  TBEC (BISACODYL) Day before procedure take 2 at 3pm and 2 at 8pm. - Signed Added new medication of REGLAN 10 MG  TABS (METOCLOPRAMIDE HCL) As per prep instructions. - Signed Rx of MIRALAX   POWD (POLYETHYLENE GLYCOL 3350) As per prep  instructions.;  #255gm x 0;  Signed;  Entered by: Sherren Kerns RN;  Authorized by: Hart Carwin MD;  Method used: Electronically to [facility] [address], [place], [place], [place]  [address], Ph: [phone], Fax: [phone] Rx of DULCOLAX 5 MG  TBEC (BISACODYL) Day before procedure take 2 at 3pm and 2 at 8pm.;  #4 x 0;  Signed;  Entered by: Sherren Kerns RN;  Authorized by: Hart Carwin MD;  Method used: Electronically to [facility] [address], [place], [place], [place]  [address], Ph: [phone], Fax: [phone] Rx of REGLAN 10 MG  TABS (METOCLOPRAMIDE HCL) As per prep instructions.;  #2 x 0;  Signed;  Entered by: Sherren Kerns RN;  Authorized by: Hart Carwin MD;  Method used: Electronically to [facility] [address], [place], [place], [place]  [address], Ph: [phone], Fax: [phone] Observations: Added new observation of ALLERGY REV: Done (06/06/2010 16:17)    Prescriptions: REGLAN 10 MG  TABS (METOCLOPRAMIDE HCL) As per prep instructions.  #2 x 0   Entered by:   Sherren Kerns RN   Authorized by:   Hart Carwin MD   Signed by:   Sherren Kerns RN on 06/06/2010   Method used:   Electronically to        [facility] (retail)       [address]       [place], [place]  [address]       Ph: [phone]       Fax: [phone]   RxID:   [id number] DULCOLAX 5 MG  TBEC (BISACODYL) Day before procedure take 2 at 3pm and 2 at 8pm.   #4 x 0   Entered by:   Sherren Kerns RN   Authorized by:   Hart Carwin MD   Signed by:   Sherren Kerns RN on 06/06/2010   Method used:   Electronically to        [facility] (retail)       [address]       [place], [place]  [address]       Ph: [phone]       Fax: [phone]   RxID:   [id number] MIRALAX   POWD (POLYETHYLENE GLYCOL 3350) As per prep  instructions.  #255gm x 0   Entered by:   Sherren Kerns RN   Authorized by:   Hart Carwin MD   Signed by:   Sherren Kerns RN on 06/06/2010   Method used:   Electronically to        [facility] (retail)       [address]       [place], [place]  [address]       Ph: [phone]       Fax: [phone]   RxID:   [id number]

## 2011-03-05 ENCOUNTER — Telehealth: Payer: Self-pay | Admitting: *Deleted

## 2011-03-05 NOTE — Telephone Encounter (Signed)
yes

## 2011-03-05 NOTE — Telephone Encounter (Signed)
Pt is coming for physical the first of February and she would like labs done by the end of November, to be within the date range for her wellness form for her insurance.  Ok to schedule?

## 2011-03-17 ENCOUNTER — Other Ambulatory Visit: Payer: Self-pay | Admitting: Family Medicine

## 2011-03-17 DIAGNOSIS — Z1231 Encounter for screening mammogram for malignant neoplasm of breast: Secondary | ICD-10-CM

## 2011-03-23 ENCOUNTER — Telehealth: Payer: Self-pay | Admitting: Family Medicine

## 2011-03-23 DIAGNOSIS — Z13220 Encounter for screening for lipoid disorders: Secondary | ICD-10-CM

## 2011-03-23 NOTE — Telephone Encounter (Signed)
Message copied by Excell Seltzer on Mon Mar 23, 2011  5:30 PM ------      Message from: Baldomero Lamy      Created: Thu Mar 19, 2011  8:48 AM      Regarding:  labs Tues10/30       Please order  future  labs for pt's upcomming lab appt.      Thanks      Rodney Booze

## 2011-03-24 ENCOUNTER — Other Ambulatory Visit (INDEPENDENT_AMBULATORY_CARE_PROVIDER_SITE_OTHER): Payer: Self-pay

## 2011-03-24 DIAGNOSIS — Z13220 Encounter for screening for lipoid disorders: Secondary | ICD-10-CM

## 2011-03-24 LAB — LIPID PANEL
Cholesterol: 174 mg/dL (ref 0–200)
HDL: 63.9 mg/dL (ref >39.00)
LDL Cholesterol: 99 mg/dL (ref 0–99)
Total CHOL/HDL Ratio: 3
Triglycerides: 56 mg/dL (ref 0.0–149.0)
VLDL: 11.2 mg/dL (ref 0.0–40.0)

## 2011-03-24 LAB — COMPREHENSIVE METABOLIC PANEL
ALT: 14 U/L (ref 0–35)
AST: 17 U/L (ref 0–37)
Albumin: 4.2 g/dL (ref 3.5–5.2)
Alkaline Phosphatase: 60 U/L (ref 39–117)
BUN: 13 mg/dL (ref 6–23)
CO2: 29 mEq/L (ref 19–32)
Calcium: 8.8 mg/dL (ref 8.4–10.5)
Chloride: 105 mEq/L (ref 96–112)
Creatinine, Ser: 0.7 mg/dL (ref 0.4–1.2)
GFR: 92.11 mL/min (ref >60.00)
Glucose, Bld: 106 mg/dL — ABNORMAL HIGH (ref 70–99)
Potassium: 3.6 mEq/L (ref 3.5–5.1)
Sodium: 140 mEq/L (ref 135–145)
Total Bilirubin: 0.4 mg/dL (ref 0.3–1.2)
Total Protein: 7.2 g/dL (ref 6.0–8.3)

## 2011-03-27 ENCOUNTER — Encounter: Payer: Self-pay | Admitting: *Deleted

## 2011-04-07 ENCOUNTER — Ambulatory Visit (INDEPENDENT_AMBULATORY_CARE_PROVIDER_SITE_OTHER): Payer: Managed Care, Other (non HMO)

## 2011-04-07 DIAGNOSIS — Z23 Encounter for immunization: Secondary | ICD-10-CM

## 2011-04-14 ENCOUNTER — Ambulatory Visit (HOSPITAL_COMMUNITY): Payer: Managed Care, Other (non HMO)

## 2011-05-12 ENCOUNTER — Ambulatory Visit (HOSPITAL_COMMUNITY)
Admission: RE | Admit: 2011-05-12 | Discharge: 2011-05-12 | Disposition: A | Discharge status: Home / Self Care | Payer: Managed Care, Other (non HMO) | Source: Ambulatory Visit | Attending: Family Medicine | Admitting: Family Medicine

## 2011-05-12 DIAGNOSIS — Z1231 Encounter for screening mammogram for malignant neoplasm of breast: Secondary | ICD-10-CM | POA: Insufficient documentation

## 2011-06-26 ENCOUNTER — Encounter: Payer: Self-pay | Admitting: Family Medicine

## 2011-06-26 ENCOUNTER — Ambulatory Visit (INDEPENDENT_AMBULATORY_CARE_PROVIDER_SITE_OTHER): Payer: Managed Care, Other (non HMO) | Admitting: Family Medicine

## 2011-06-26 ENCOUNTER — Other Ambulatory Visit (HOSPITAL_COMMUNITY)
Admission: RE | Admit: 2011-06-26 | Discharge: 2011-06-26 | Disposition: A | Discharge status: Home / Self Care | Payer: Managed Care, Other (non HMO) | Source: Ambulatory Visit | Attending: Family Medicine | Admitting: Family Medicine

## 2011-06-26 VITALS — BP 102/60 | HR 69 | Temp 98.5°F | Ht 61.0 in | Wt 127.2 lb

## 2011-06-26 DIAGNOSIS — L909 Atrophic disorder of skin, unspecified: Secondary | ICD-10-CM

## 2011-06-26 DIAGNOSIS — Z1159 Encounter for screening for other viral diseases: Secondary | ICD-10-CM | POA: Insufficient documentation

## 2011-06-26 DIAGNOSIS — Z0000 Encounter for general adult medical examination without abnormal findings: Secondary | ICD-10-CM

## 2011-06-26 DIAGNOSIS — Z01419 Encounter for gynecological examination (general) (routine) without abnormal findings: Secondary | ICD-10-CM

## 2011-06-26 DIAGNOSIS — L309 Dermatitis, unspecified: Secondary | ICD-10-CM | POA: Insufficient documentation

## 2011-06-26 DIAGNOSIS — K589 Irritable bowel syndrome without diarrhea: Secondary | ICD-10-CM | POA: Insufficient documentation

## 2011-06-26 DIAGNOSIS — L259 Unspecified contact dermatitis, unspecified cause: Secondary | ICD-10-CM

## 2011-06-26 DIAGNOSIS — L919 Hypertrophic disorder of the skin, unspecified: Secondary | ICD-10-CM

## 2011-06-26 DIAGNOSIS — L918 Other hypertrophic disorders of the skin: Secondary | ICD-10-CM | POA: Insufficient documentation

## 2011-06-26 MED ORDER — FLUOCINONIDE 0.1 % EX CREA: TOPICAL_CREAM | CUTANEOUS | Status: DC

## 2011-06-26 NOTE — Assessment & Plan Note (Signed)
Increase dose of topical sterois. For flares, use moisturizing cream in between flares with mild steroid cream. Avoid skin irritants.

## 2011-06-26 NOTE — Progress Notes (Signed)
Subjective:    Patient ID: Natalie Hudson, female    DOB: 1961/02/07, 51 y.o.   MRN: [id number]  HPI 51 year old female presents for annual wellness visit. Labs reviewed in detail.  Cholesterol at goal.  New diagnosis prediabetes:  Anxiety: No issues now . Denies depression.  Feeling well overall.  In last 4 months has been noting lower BPs... 101/51. Asymptomatic. No lightheadedness. No change in eating habits. 2 lb weight loss. No medications or OTC supplements.      Review of Systems  Constitutional: Negative for fever, fatigue and unexpected weight change.  HENT: Negative for ear pain, congestion, sore throat, sneezing, trouble swallowing and sinus pressure.   Eyes: Negative for pain and itching.  Respiratory: Negative for cough, shortness of breath and wheezing.   Cardiovascular: Negative for chest pain, palpitations and leg swelling.  Gastrointestinal: Negative for nausea, abdominal pain, diarrhea, constipation and blood in stool.       IBS flares occ, but controlled with diet.  Genitourinary: Negative for dysuria, hematuria, vaginal discharge, difficulty urinating and menstrual problem.  Skin: Negative for rash.  Neurological: Negative for syncope, weakness, light-headedness, numbness and headaches.  Psychiatric/Behavioral: Negative for confusion and dysphoric mood. The patient is not nervous/anxious.        Objective:   Physical Exam  Constitutional: Vital signs are normal. She appears well-developed and well-nourished. She is cooperative.  Non-toxic appearance. She does not appear ill. No distress.  HENT:  Head: Normocephalic.  Right Ear: Hearing, tympanic membrane, external ear and ear canal normal.  Left Ear: Hearing, tympanic membrane, external ear and ear canal normal.  Nose: Nose normal.  Eyes: Conjunctivae, EOM and lids are normal. Pupils are equal, round, and reactive to light. No foreign bodies found.  Neck: Trachea normal and normal range of motion.  Neck supple. Carotid bruit is not present. No mass and no thyromegaly present.  Cardiovascular: Normal rate, regular rhythm, S1 normal, S2 normal, normal heart sounds and intact distal pulses.  Exam reveals no gallop.   No murmur heard. Pulmonary/Chest: Effort normal and breath sounds normal. No respiratory distress. She has no wheezes. She has no rhonchi. She has no rales.  Abdominal: Soft. Normal appearance and bowel sounds are normal. She exhibits no distension, no fluid wave, no abdominal bruit and no mass. There is no hepatosplenomegaly. There is no tenderness. There is no rebound, no guarding and no CVA tenderness. No hernia.  Genitourinary: Vagina normal and uterus normal. No breast swelling, tenderness, discharge or bleeding. Pelvic exam was performed with patient prone. There is no rash, tenderness or lesion on the right labia. There is no rash, tenderness or lesion on the left labia. Uterus is not enlarged and not tender. Cervix exhibits no motion tenderness, no discharge and no friability. Right adnexum displays no mass, no tenderness and no fullness. Left adnexum displays no mass, no tenderness and no fullness.       Pap performed.  Lymphadenopathy:    She has no cervical adenopathy.    She has no axillary adenopathy.  Neurological: She is alert. She has normal strength. No cranial nerve deficit or sensory deficit.  Skin: Skin is warm, dry and intact. Rash noted.       Dry skin on hands, cracking at knucles.  Pink patch  On right calf.  No blisters, no pustules.  Psychiatric: Her speech is normal and behavior is normal. Judgment normal. Her mood appears not anxious. Cognition and memory are normal. She does not exhibit  a depressed mood.          Assessment & Plan:  CPX: The patient's preventative maintenance and recommended screening tests for an annual wellness exam were reviewed in full today. Brought up to date unless services declined.  Counselled on the importance of diet,  exercise, and its role in overall health and mortality. The patient's FH and SH was reviewed, including their home life, tobacco status, and drug and alcohol status.   Vaccines: Uptodate with flu, Tdap. PAP/DVE:  2010, 2011.. Will obtain one more for 3 nmls in a row then begin spacing. Mammo: Done 12/12 Colon:

## 2011-06-26 NOTE — Assessment & Plan Note (Signed)
Area cleaned with alcohol, ethyl chloride spray for numbing. Area removed with scissors. Minimal bleeding, hemostasis achieved.

## 2011-06-26 NOTE — Patient Instructions (Addendum)
Continue working on low carbohydrate diet. Work on regular exercise. Try lidex cream, stronger version for flares. In between flares use lower dose steroid cream OTC (hydrocortisone) and twice dailt hydrating cream like cetaphil with no alcohol. Use hypoallergenic soaps.

## 2011-07-06 ENCOUNTER — Encounter: Payer: Self-pay | Admitting: *Deleted

## 2012-04-04 ENCOUNTER — Telehealth: Payer: Self-pay | Admitting: Family Medicine

## 2012-04-04 DIAGNOSIS — Z13220 Encounter for screening for lipoid disorders: Secondary | ICD-10-CM

## 2012-04-04 NOTE — Telephone Encounter (Signed)
Pt needs a lab appointment before the end of the year for employer's health assessment.  Would it be possible for you put in an order for the health assessment labs so we can call her back w/an appointment? Thank you.

## 2012-04-05 NOTE — Telephone Encounter (Signed)
i put in an oreder for fasting labs.. If specific labs are neded otherwise pt needs to let us know.

## 2012-04-19 ENCOUNTER — Other Ambulatory Visit: Payer: Managed Care, Other (non HMO)

## 2012-04-26 ENCOUNTER — Telehealth: Payer: Self-pay | Admitting: Family Medicine

## 2012-04-26 ENCOUNTER — Other Ambulatory Visit (INDEPENDENT_AMBULATORY_CARE_PROVIDER_SITE_OTHER): Payer: Managed Care, Other (non HMO)

## 2012-04-26 ENCOUNTER — Other Ambulatory Visit: Payer: Self-pay | Admitting: Family Medicine

## 2012-04-26 DIAGNOSIS — Z1231 Encounter for screening mammogram for malignant neoplasm of breast: Secondary | ICD-10-CM

## 2012-04-26 DIAGNOSIS — Z13220 Encounter for screening for lipoid disorders: Secondary | ICD-10-CM

## 2012-04-26 LAB — COMPREHENSIVE METABOLIC PANEL
ALT: 13 U/L (ref 0–35)
AST: 17 U/L (ref 0–37)
Albumin: 4.2 g/dL (ref 3.5–5.2)
Alkaline Phosphatase: 57 U/L (ref 39–117)
Potassium: 4.2 mEq/L (ref 3.5–5.1)
Sodium: 139 mEq/L (ref 135–145)
Total Protein: 7.1 g/dL (ref 6.0–8.3)

## 2012-04-26 LAB — LIPID PANEL
LDL Cholesterol: 89 mg/dL (ref 0–99)
Total CHOL/HDL Ratio: 3

## 2012-04-26 NOTE — Telephone Encounter (Signed)
Notify pt that labs show cholesterol well controlled but blood sugar very elevated. .. She was fasting right?  Very close to prediabetes. Send info on prediabetes... Have her return for CMET A1C  Fasting Dx prediabetes ( add diagnosis to prob lost as well) in 3 months.

## 2012-05-05 ENCOUNTER — Encounter: Payer: Self-pay | Admitting: *Deleted

## 2012-05-05 ENCOUNTER — Telehealth: Payer: Self-pay

## 2012-05-05 NOTE — Telephone Encounter (Signed)
Pt left v/m; pt got results and requesting copy of recent lab results mailed to pts home address.

## 2012-05-12 NOTE — Telephone Encounter (Signed)
COPY MAILED TO PATIENT

## 2012-05-13 ENCOUNTER — Ambulatory Visit (HOSPITAL_COMMUNITY)
Admission: RE | Admit: 2012-05-13 | Discharge: 2012-05-13 | Disposition: A | Discharge status: Home / Self Care | Payer: Managed Care, Other (non HMO) | Source: Ambulatory Visit | Attending: Family Medicine | Admitting: Family Medicine

## 2012-05-13 DIAGNOSIS — Z1231 Encounter for screening mammogram for malignant neoplasm of breast: Secondary | ICD-10-CM | POA: Insufficient documentation

## 2012-07-26 ENCOUNTER — Other Ambulatory Visit: Payer: Managed Care, Other (non HMO)

## 2012-07-27 ENCOUNTER — Other Ambulatory Visit (INDEPENDENT_AMBULATORY_CARE_PROVIDER_SITE_OTHER): Payer: Managed Care, Other (non HMO)

## 2012-07-27 DIAGNOSIS — R7309 Other abnormal glucose: Secondary | ICD-10-CM

## 2012-07-27 DIAGNOSIS — R7303 Prediabetes: Secondary | ICD-10-CM

## 2012-07-27 LAB — COMPREHENSIVE METABOLIC PANEL
AST: 17 U/L (ref 0–37)
Albumin: 3.9 g/dL (ref 3.5–5.2)
BUN: 14 mg/dL (ref 6–23)
CO2: 29 mEq/L (ref 19–32)
Calcium: 8.9 mg/dL (ref 8.4–10.5)
Chloride: 104 mEq/L (ref 96–112)
Creatinine, Ser: 0.7 mg/dL (ref 0.4–1.2)
GFR: 94.7 mL/min (ref >60.00)
Glucose, Bld: 105 mg/dL — ABNORMAL HIGH (ref 70–99)

## 2012-07-29 ENCOUNTER — Ambulatory Visit: Payer: Managed Care, Other (non HMO) | Admitting: Family Medicine

## 2012-09-09 ENCOUNTER — Ambulatory Visit (INDEPENDENT_AMBULATORY_CARE_PROVIDER_SITE_OTHER): Payer: Managed Care, Other (non HMO) | Admitting: Family Medicine

## 2012-09-09 ENCOUNTER — Encounter: Payer: Self-pay | Admitting: Family Medicine

## 2012-09-09 VITALS — BP 120/60 | HR 71 | Temp 98.1°F | Ht 61.0 in | Wt 129.5 lb

## 2012-09-09 DIAGNOSIS — H8110 Benign paroxysmal vertigo, unspecified ear: Secondary | ICD-10-CM

## 2012-09-09 DIAGNOSIS — Z0000 Encounter for general adult medical examination without abnormal findings: Secondary | ICD-10-CM

## 2012-09-09 DIAGNOSIS — F411 Generalized anxiety disorder: Secondary | ICD-10-CM

## 2012-09-09 NOTE — Assessment & Plan Note (Signed)
Home Eply maneuver. Let me know if  a flare, not improving after 2 weeks.

## 2012-09-09 NOTE — Progress Notes (Signed)
52 year old female presents for annual wellness visit.  Labs reviewed in detail.   Had some GERD flares in  2/3 2104... improved with acid reducer (famotidine) prn. Improved now.  Two episode in last 6-7 months of vertigo, room spinning with movement, nausea, occ vomiting. Worse with lying flat or turning head to side. BP and sugar was nml. Has history of similar few years ago.  Dramamine helped some.  Prediabetes:  Improved fasting CBG last OV with lifestyle changes. Lab Results  Component Value Date   HGBA1C 6.2 07/27/2012   Not getting much exercise.. moderate diet.  Cholesterol at goal last check. Lab Results  Component Value Date   CHOL 170 04/26/2012   HDL 66.20 04/26/2012   LDLCALC 89 04/26/2012   TRIG 73.0 04/26/2012   CHOLHDL 3 04/26/2012     Anxiety: No issues now . Denies depression.  Feeling well overall.   No medications or OTC supplements.   Review of Systems  Constitutional: Negative for fever, fatigue and unexpected weight change.  HENT: Negative for ear pain, congestion, sore throat, sneezing, trouble swallowing and sinus pressure.  Eyes: Negative for pain and itching.  Respiratory: Negative for cough, shortness of breath and wheezing.  Cardiovascular: Negative for chest pain, palpitations and leg swelling.  Gastrointestinal: Negative for nausea, abdominal pain, diarrhea, constipation and blood in stool.  IBS flares occ, but controlled with diet.  Genitourinary: Negative for dysuria, hematuria, vaginal discharge, difficulty urinating and menstrual problem.  Skin: Negative for rash.  Neurological: Negative for syncope, weakness, light-headedness, numbness and headaches.  Psychiatric/Behavioral: Negative for confusion and dysphoric mood. The patient is not nervous/anxious.   Objective:   Physical Exam  Constitutional: Vital signs are normal. She appears well-developed and well-nourished. She is cooperative. Non-toxic appearance. She does not appear ill. No  distress.  HENT:  Head: Normocephalic.  Right Ear: Hearing, tympanic membrane, external ear and ear canal normal.  Left Ear: Hearing, tympanic membrane, external ear and ear canal normal.  Nose: Nose normal.  Eyes: Conjunctivae, EOM and lids are normal. Pupils are equal, round, and reactive to light. No foreign bodies found.  Neck: Trachea normal and normal range of motion. Neck supple. Carotid bruit is not present. No mass and no thyromegaly present.  Cardiovascular: Normal rate, regular rhythm, S1 normal, S2 normal, normal heart sounds and intact distal pulses. Exam reveals no gallop.  No murmur heard.  Pulmonary/Chest: Effort normal and breath sounds normal. No respiratory distress. She has no wheezes. She has no rhonchi. She has no rales.  Abdominal: Soft. Normal appearance and bowel sounds are normal. She exhibits no distension, no fluid wave, no abdominal bruit and no mass. There is no hepatosplenomegaly. There is no tenderness. There is no rebound, no guarding and no CVA tenderness. No hernia.  Genitourinary: Vagina normal and uterus normal. No breast swelling, tenderness, discharge or bleeding. Pelvic exam was performed with patient supine. There is no rash, tenderness or lesion on the right labia. There is no rash, tenderness or lesion on the left labia. Uterus is not enlarged and not tender. Cervix exhibits no motion tenderness, no discharge and no friability. Right adnexum displays no mass, no tenderness and no fullness. Left adnexum displays no mass, no tenderness and no fullness.  Pap  NOT performed.  Lymphadenopathy:  She has no cervical adenopathy.  She has no axillary adenopathy.  Neurological: She is alert. She has normal strength. No cranial nerve deficit or sensory deficit. Nml cerebellar exam.  Skin: Skin is  warm, dry and intact. No rash.Psychiatric: Her speech is normal and behavior is normal. Judgment normal. Her mood appears not anxious. Cognition and memory are normal. She  does not exhibit a depressed mood.   Assessment & Plan:   CPX: The patient's preventative maintenance and recommended screening tests for an annual wellness exam were reviewed in full today.  Brought up to date unless services declined.  Counselled on the importance of diet, exercise, and its role in overall health and mortality.  The patient's FH and SH was reviewed, including their home life, tobacco status, and drug and alcohol status.   Vaccines: Uptodate with flu, Tdap.  PAP/DVE: 2010, 2011.. 2013, DVE yearly, on q 3 year pap schedule Mammo: Done 12/13 Colon: nml 05/2010, Dr. Juanda Chance, repeat in 10 years.

## 2012-09-09 NOTE — Assessment & Plan Note (Signed)
Stable control. 

## 2012-09-09 NOTE — Patient Instructions (Addendum)
Can try home eply maneuver for vertigo. Let me know if with a flare, not improving after 2 weeks.  Work on regular exercise, low carb diet.

## 2012-09-13 ENCOUNTER — Encounter: Payer: Managed Care, Other (non HMO) | Admitting: Family Medicine

## 2013-04-25 ENCOUNTER — Other Ambulatory Visit: Payer: Self-pay | Admitting: Family Medicine

## 2013-04-25 DIAGNOSIS — Z1231 Encounter for screening mammogram for malignant neoplasm of breast: Secondary | ICD-10-CM

## 2013-05-08 ENCOUNTER — Telehealth: Payer: Self-pay | Admitting: Family Medicine

## 2013-05-08 DIAGNOSIS — R7303 Prediabetes: Secondary | ICD-10-CM

## 2013-05-08 DIAGNOSIS — E119 Type 2 diabetes mellitus without complications: Secondary | ICD-10-CM | POA: Insufficient documentation

## 2013-05-08 DIAGNOSIS — E1169 Type 2 diabetes mellitus with other specified complication: Secondary | ICD-10-CM | POA: Insufficient documentation

## 2013-05-08 NOTE — Telephone Encounter (Signed)
Message copied by Excell Seltzer on Mon May 08, 2013  8:22 AM ------      Message from: Alvina Chou      Created: Fri Apr 28, 2013  5:08 PM      Regarding: Lab orders for Tuesday, 12.16.14       Patient is scheduled for CPX labs, please order future labs, Thanks , Terri       ------

## 2013-05-09 ENCOUNTER — Other Ambulatory Visit (INDEPENDENT_AMBULATORY_CARE_PROVIDER_SITE_OTHER): Payer: Managed Care, Other (non HMO)

## 2013-05-09 DIAGNOSIS — R7303 Prediabetes: Secondary | ICD-10-CM

## 2013-05-09 DIAGNOSIS — Z0000 Encounter for general adult medical examination without abnormal findings: Secondary | ICD-10-CM

## 2013-05-09 DIAGNOSIS — R7309 Other abnormal glucose: Secondary | ICD-10-CM

## 2013-05-09 LAB — LIPID PANEL
Cholesterol: 170 mg/dL (ref 0–200)
HDL: 62 mg/dL (ref >39.00)
Triglycerides: 90 mg/dL (ref 0.0–149.0)

## 2013-05-09 LAB — COMPREHENSIVE METABOLIC PANEL
CO2: 28 mEq/L (ref 19–32)
Calcium: 9 mg/dL (ref 8.4–10.5)
Chloride: 104 mEq/L (ref 96–112)
Creatinine, Ser: 0.7 mg/dL (ref 0.4–1.2)
GFR: 101.15 mL/min (ref >60.00)
Glucose, Bld: 97 mg/dL (ref 70–99)
Total Bilirubin: 0.7 mg/dL (ref 0.3–1.2)

## 2013-05-16 ENCOUNTER — Ambulatory Visit (HOSPITAL_COMMUNITY)
Admission: RE | Admit: 2013-05-16 | Discharge: 2013-05-16 | Disposition: A | Discharge status: Home / Self Care | Payer: Managed Care, Other (non HMO) | Source: Ambulatory Visit | Attending: Family Medicine | Admitting: Family Medicine

## 2013-05-16 DIAGNOSIS — Z1231 Encounter for screening mammogram for malignant neoplasm of breast: Secondary | ICD-10-CM

## 2013-08-21 ENCOUNTER — Ambulatory Visit (INDEPENDENT_AMBULATORY_CARE_PROVIDER_SITE_OTHER): Payer: Managed Care, Other (non HMO) | Admitting: Family Medicine

## 2013-08-21 ENCOUNTER — Encounter: Payer: Self-pay | Admitting: Family Medicine

## 2013-08-21 VITALS — BP 110/80 | HR 81 | Temp 98.2°F | Ht 61.0 in | Wt 126.5 lb

## 2013-08-21 DIAGNOSIS — L0201 Cutaneous abscess of face: Secondary | ICD-10-CM

## 2013-08-21 DIAGNOSIS — R21 Rash and other nonspecific skin eruption: Secondary | ICD-10-CM

## 2013-08-21 DIAGNOSIS — L03211 Cellulitis of face: Principal | ICD-10-CM

## 2013-08-21 DIAGNOSIS — R238 Other skin changes: Secondary | ICD-10-CM

## 2013-08-21 MED ORDER — VALACYCLOVIR HCL 1 G PO TABS: 1000.0000 mg | ORAL_TABLET | Freq: Three times a day (TID) | ORAL | Status: DC

## 2013-08-21 MED ORDER — CEPHALEXIN 500 MG PO CAPS: 1000.0000 mg | ORAL_CAPSULE | Freq: Two times a day (BID) | ORAL | Status: DC

## 2013-08-21 NOTE — Progress Notes (Signed)
Date:  08/21/2013   Name:  Natalie FurryRobin J Hudson   DOB:  December 03, 1960   MRN:  [id number] Gender: female Age: 53 y.o.  Primary Physician:  Kerby NoraAmy Bedsole, MD   Chief Complaint: Headache, Neck Pain and lesion on head   Subjective:   History of Present Illness:  Natalie FurryRobin J Hudson is a 53 y.o. very pleasant female patient who presents with the following:  Several - has been really worried about her husband who was having a lot of anxiety - -severe.   A few weeks ago, was hurting a lot across her shoulders and back. A few weeks ago, was having some pain down her neck. Felt a knot behind her left ear.   The next morning she seemed to have gone down a little bit. Since Friday, has had a lot of headaches. Left frontal region. Sometimes it is pounding and going into her eyes.  Very tender and also has a boil in her head. There are some additional areas of vesicular rash on l forehead.  Past Medical History, Surgical History, Social History, Family History, Problem List, Medications, and Allergies have been reviewed and updated if relevant.  Review of Systems: ROS: GEN: Acute illness details above GI: Tolerating PO intake GU: maintaining adequate hydration and urination Pulm: No SOB Interactive and getting along well at home.  Otherwise, ROS is as per the HPI.   Objective:   Physical Examination: BP 110/80  Pulse 81  Temp(Src) 98.2 F (36.8 C) (Oral)  Ht 5\' 1"  (1.549 m)  Wt 126 lb 8 oz (57.38 kg)  BMI 23.91 kg/m2   GEN: WDWN, NAD, Non-toxic, A & O x 3 HEENT: Atraumatic, Normocephalic. Neck supple. No masses, post-auricular LAD. Top of head on L, elevated, tender boil with surrounding vesicles and some change on L forehead.  Ears and Nose: No external deformity. TM clear CV: RRR, No M/G/R. No JVD. No thrill. No extra heart sounds. PULM: CTA B, no wheezes, crackles, rhonchi. No retractions. No resp. distress. No accessory muscle use. EXTR: No c/c/e NEURO Normal gait.  PSYCH:  Normally interactive. Conversant. Not depressed or anxious appearing.  Calm demeanor.     Laboratory and Imaging Data:  Assessment & Plan:   Cellulitis and abscess of face  Vesicular rash  I think more likely boil and cellulitis, but could also be early shingles. Will treat for both for now. Discussed if in eyes at all, ASAP optho evaluation  No Follow-up on file.  No orders of the defined types were placed in this encounter.   Patient's Medications  New Prescriptions   CEPHALEXIN (KEFLEX) 500 MG CAPSULE    Take 2 capsules (1,000 mg total) by mouth 2 (two) times daily.   VALACYCLOVIR (VALTREX) 1000 MG TABLET    Take 1 tablet (1,000 mg total) by mouth 3 (three) times daily.  Previous Medications   BIOTIN PO    Take 1 tablet by mouth. two to three x week   CALCIUM CARBONATE-VITAMIN D (CALTRATE 600+D) 600-400 MG-UNIT PER CHEW TABLET    Chew 1 tablet by mouth daily.   FAMOTIDINE (PEPCID) 10 MG TABLET    Take 10 mg by mouth 2 (two) times daily.   FLUOCINONIDE 0.1 % CREA    Apply to affected area (hands primarily) for flares  Modified Medications   No medications on file  Discontinued Medications   No medications on file   There are no Patient Instructions on file for this visit.  Signed,  Elpidio GaleaSpencer T.  Demetrio Leighty, MD, CAQ Sports Medicine  [facility] [address] [place] [address] Phone: [phone] Fax: [phone]

## 2013-08-21 NOTE — Progress Notes (Signed)
Pre visit review using our clinic review tool, if applicable. No additional management support is needed unless otherwise documented below in the visit note. 

## 2013-08-23 ENCOUNTER — Telehealth: Payer: Self-pay

## 2013-08-23 MED ORDER — PREGABALIN 75 MG PO CAPS: 75.0000 mg | ORAL_CAPSULE | Freq: Two times a day (BID) | ORAL | Status: DC

## 2013-08-23 NOTE — Telephone Encounter (Signed)
Lyrica 75 mg, 1 po bid, #60, 1 refill  Neuropathic pain agents such as this work the best - nothing is perfect if shingles.

## 2013-08-23 NOTE — Telephone Encounter (Signed)
Call  

## 2013-08-23 NOTE — Telephone Encounter (Signed)
Called to [facility] [address]  Patient notified prescription has been called to her pharmacy.

## 2013-08-23 NOTE — Telephone Encounter (Signed)
Spoke with Ms. Natalie Hudson.   She states the pain is mostly around her left temple.  The areas noted at last office visit has started blistering up but is still contained to the left side of her face.  Took Ibuprofen 800 mg and got a little relief for about 2 hours.  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  She also is wondering is she should be covering these areas up or putting some type of cream on them.  Requesting something for pain that is not a narcotic.  Please Advise.

## 2013-08-23 NOTE — Telephone Encounter (Signed)
Pt left v/m; pt seen 08/21/13 with dx of shingles; pt having a lot of pain in head and pt request pain med that is not a narcotic to [facility] [address] Pt request cb.

## 2013-08-23 NOTE — Telephone Encounter (Signed)
c 

## 2013-08-31 ENCOUNTER — Telehealth: Payer: Self-pay

## 2013-08-31 NOTE — Telephone Encounter (Signed)
Ms. Caralee Atesndrews notified as instructed by telephone.  Would like to try the Lyrica 75 mg three times a day first.  Advised if pain continues or still has breakthrough pain to let us know and we can increase her Lyrica to a stronger dose.

## 2013-08-31 NOTE — Telephone Encounter (Signed)
Pt left v/m wanting to know if could increase Lyrica 75 mg taking one cap twice a day to one cap three times a day; pt said med runs out in between pills and pain returns. Pt request cb.

## 2013-08-31 NOTE — Telephone Encounter (Signed)
You can definitely take this three times a day.  I am glad it is helping some.   You can go even higher on the dose, too, if she is still hurting a lot. If still breakthrough pain, i can send in a stronger pill to take TID

## 2014-04-10 ENCOUNTER — Other Ambulatory Visit: Payer: Self-pay | Admitting: Family Medicine

## 2014-04-10 DIAGNOSIS — Z1231 Encounter for screening mammogram for malignant neoplasm of breast: Secondary | ICD-10-CM

## 2014-04-27 ENCOUNTER — Telehealth: Payer: Self-pay

## 2014-04-27 MED ORDER — CLOBETASOL PROPIONATE 0.05 % EX CREA: 1.0000 "application " | TOPICAL_CREAM | Freq: Two times a day (BID) | CUTANEOUS | Status: DC

## 2014-04-27 NOTE — Telephone Encounter (Signed)
Pt request refill Clobetasol 0.05% cream;pt applies daily for itchy hands; pt uses especially when weather gets cold. Is not on med list and dermatologist gave pt years ago.Please advise.[facility] [address]

## 2014-04-27 NOTE — Telephone Encounter (Signed)
Rx sent 

## 2014-05-02 ENCOUNTER — Telehealth: Payer: Self-pay | Admitting: Family Medicine

## 2014-05-02 DIAGNOSIS — Z13220 Encounter for screening for lipoid disorders: Secondary | ICD-10-CM

## 2014-05-02 DIAGNOSIS — R7303 Prediabetes: Secondary | ICD-10-CM

## 2014-05-02 NOTE — Telephone Encounter (Signed)
-----   Message from Alvina Chouerri J Walsh sent at 05/01/2014  3:19 PM EST ----- Regarding: Lab orders for Thursday, 12.10.15 Patient is scheduled for CPX labs, please order future labs, Thanks , Camelia Engerri

## 2014-05-03 ENCOUNTER — Other Ambulatory Visit (INDEPENDENT_AMBULATORY_CARE_PROVIDER_SITE_OTHER): Payer: Managed Care, Other (non HMO)

## 2014-05-03 DIAGNOSIS — R7303 Prediabetes: Secondary | ICD-10-CM

## 2014-05-03 DIAGNOSIS — R7309 Other abnormal glucose: Secondary | ICD-10-CM

## 2014-05-03 DIAGNOSIS — Z13220 Encounter for screening for lipoid disorders: Secondary | ICD-10-CM

## 2014-05-03 LAB — COMPREHENSIVE METABOLIC PANEL
ALT: 13 U/L (ref 0–35)
AST: 16 U/L (ref 0–37)
Albumin: 4 g/dL (ref 3.5–5.2)
Alkaline Phosphatase: 52 U/L (ref 39–117)
BUN: 14 mg/dL (ref 6–23)
CALCIUM: 8.9 mg/dL (ref 8.4–10.5)
CHLORIDE: 104 meq/L (ref 96–112)
CO2: 28 meq/L (ref 19–32)
CREATININE: 0.7 mg/dL (ref 0.4–1.2)
GFR: 97.32 mL/min (ref >60.00)
GLUCOSE: 104 mg/dL — AB (ref 70–99)
Potassium: 4.1 mEq/L (ref 3.5–5.1)
Sodium: 138 mEq/L (ref 135–145)
Total Bilirubin: 0.6 mg/dL (ref 0.2–1.2)
Total Protein: 6.9 g/dL (ref 6.0–8.3)

## 2014-05-03 LAB — LIPID PANEL
CHOLESTEROL: 176 mg/dL (ref 0–200)
HDL: 73.8 mg/dL (ref >39.00)
LDL Cholesterol: 92 mg/dL (ref 0–99)
NONHDL: 102.2
Total CHOL/HDL Ratio: 2
Triglycerides: 52 mg/dL (ref 0.0–149.0)
VLDL: 10.4 mg/dL (ref 0.0–40.0)

## 2014-05-03 LAB — HEMOGLOBIN A1C: Hgb A1c MFr Bld: 6.5 % (ref 4.6–6.5)

## 2014-05-11 ENCOUNTER — Ambulatory Visit (INDEPENDENT_AMBULATORY_CARE_PROVIDER_SITE_OTHER): Payer: Managed Care, Other (non HMO) | Admitting: Family Medicine

## 2014-05-11 ENCOUNTER — Encounter: Payer: Self-pay | Admitting: Family Medicine

## 2014-05-11 VITALS — BP 131/86 | HR 79 | Temp 98.3°F | Ht 60.0 in | Wt 127.5 lb

## 2014-05-11 DIAGNOSIS — R7303 Prediabetes: Secondary | ICD-10-CM

## 2014-05-11 DIAGNOSIS — R7309 Other abnormal glucose: Secondary | ICD-10-CM

## 2014-05-11 DIAGNOSIS — Z0000 Encounter for general adult medical examination without abnormal findings: Secondary | ICD-10-CM

## 2014-05-11 NOTE — Progress Notes (Signed)
53 year old female presents for annual wellness visit.  Labs reviewed in detail.   Husband out of a job. Husband with anxiety disorder. Daughter recently sexually active. She has been occ anxious, mild depression but does not feel she needs to treat other then with stress reduction.   She had episode of shingles in early year. Treated with valacyclovir and lyrica. No painnow.  Prediabetes: Worsened fasting CBG  Lab Results  Component Value Date   HGBA1C 6.5 05/03/2014  Exercise: None  Diet: Poor.. Given stress  Cholesterol at goal last check. LDL < 130 Lab Results  Component Value Date   CHOL 176 05/03/2014   HDL 73.80 05/03/2014   LDLCALC 92 05/03/2014   TRIG 52.0 05/03/2014   CHOLHDL 2 05/03/2014    No medications or OTC supplements.   Review of Systems  Constitutional: Negative for fever, fatigue and unexpected weight change.  HENT: Negative for ear pain, congestion, sore throat, sneezing, trouble swallowing and sinus pressure.  Eyes: Negative for pain and itching.  Respiratory: Negative for cough, shortness of breath and wheezing.  Cardiovascular: Negative for chest pain, palpitations and leg swelling.  Gastrointestinal: Negative for nausea, abdominal pain, diarrhea, constipation and blood in stool.  IBS flares occ, but controlled with diet.  Genitourinary: Negative for dysuria, hematuria, vaginal discharge, difficulty urinating and menstrual problem.  Skin: Negative for rash.  Neurological: Negative for syncope, weakness, light-headedness, numbness and headaches.  Psychiatric/Behavioral: Negative for confusion and dysphoric mood. The patient is not nervous/anxious.   Objective:   Physical Exam  Constitutional: Vital signs are normal. She appears well-developed and well-nourished. She is cooperative. Non-toxic appearance. She does not appear ill. No distress.  HENT:  Head: Normocephalic.  Right Ear: Hearing, tympanic membrane, external ear and ear  canal normal.  Left Ear: Hearing, tympanic membrane, external ear and ear canal normal.  Nose: Nose normal.  Eyes: Conjunctivae, EOM and lids are normal. Pupils are equal, round, and reactive to light. No foreign bodies found.  Neck: Trachea normal and normal range of motion. Neck supple. Carotid bruit is not present. No mass and no thyromegaly present.  Cardiovascular: Normal rate, regular rhythm, S1 normal, S2 normal, normal heart sounds and intact distal pulses. Exam reveals no gallop.  No murmur heard.  Pulmonary/Chest: Effort normal and breath sounds normal. No respiratory distress. She has no wheezes. She has no rhonchi. She has no rales.  Abdominal: Soft. Normal appearance and bowel sounds are normal. She exhibits no distension, no fluid wave, no abdominal bruit and no mass. There is no hepatosplenomegaly. There is no tenderness. There is no rebound, no guarding and no CVA tenderness. No hernia.  Genitourinary: Vagina normal and uterus normal. No breast swelling, tenderness, discharge or bleeding. Pelvic exam was performed with patient supine. There is no rash, tenderness or lesion on the right labia. There is no rash, tenderness or lesion on the left labia. Uterus is not enlarged and not tender. Cervix exhibits no motion tenderness, no discharge and no friability. Right adnexum displays no mass, no tenderness and no fullness. Left adnexum displays no mass, no tenderness and no fullness.  Pap NOT performed.  Lymphadenopathy:  She has no cervical adenopathy.  She has no axillary adenopathy.  Neurological: She is alert. She has normal strength. No cranial nerve deficit or sensory deficit. Nml cerebellar exam.  Skin: Skin is warm, dry and intact. No rash.Psychiatric: Her speech is normal and behavior is normal. Judgment normal. Her mood appears not anxious. Cognition and memory  are normal. She does not exhibit a depressed mood.   Assessment & Plan:   CPX: The patient's preventative  maintenance and recommended screening tests for an annual wellness exam were reviewed in full today.  Brought up to date unless services declined.  Counselled on the importance of diet, exercise, and its role in overall health and mortality.  The patient's FH and SH was reviewed, including their home life, tobacco status, and drug and alcohol status.   Vaccines: Uptodate with flu, Tdap.  PAP/DVE: 2010, 2011.. 2013, DVE yearly, on q 3 year pap schedule Next due 2016 Mammo: scheduled Colon: nml 05/2010, Dr. Juanda ChanceBrodie, repeat in 10 years.  non smoker

## 2014-05-11 NOTE — Patient Instructions (Signed)
Work on exercise and low carb diet.

## 2014-05-11 NOTE — Progress Notes (Signed)
Pre visit review using our clinic review tool, if applicable. No additional management support is needed unless otherwise documented below in the visit note. 

## 2014-05-11 NOTE — Assessment & Plan Note (Signed)
Worsened control in last year. Discussed exercise and low carb diet to lower A1C. Recheck in 1 year.

## 2014-05-22 ENCOUNTER — Ambulatory Visit (HOSPITAL_COMMUNITY)
Admission: RE | Admit: 2014-05-22 | Discharge: 2014-05-22 | Disposition: A | Discharge status: Home / Self Care | Payer: Managed Care, Other (non HMO) | Source: Ambulatory Visit | Attending: Family Medicine | Admitting: Family Medicine

## 2014-05-22 DIAGNOSIS — Z1231 Encounter for screening mammogram for malignant neoplasm of breast: Secondary | ICD-10-CM | POA: Insufficient documentation

## 2014-06-12 ENCOUNTER — Telehealth: Payer: Self-pay

## 2014-06-12 NOTE — Telephone Encounter (Signed)
Appointment scheduled 06/15/2014 at 11:00 am with Dr. Ermalene SearingBedsole to evaluate headaches.

## 2014-06-12 NOTE — Telephone Encounter (Signed)
Recommend appt to eval for cause of headache.

## 2014-06-12 NOTE — Telephone Encounter (Signed)
Pt was seen 05/11/14 for check up and discussed h/a with Dr Ermalene SearingBedsole; since cold weather started pt has dull h/a constantly for 1 1/2 weeks. Pain level is 8. Taking Ibuprofen 800 mg q 6 h without relief; pt thinks may be related to previous shingles outbreak. No rash now. On and off has dizziness. No CP or SOB. Pt has appt with eye dr 06/15/14 to ck for glaucoma. Pt request med for h/a. [facility] [place]. Pt request cb.

## 2014-06-15 ENCOUNTER — Ambulatory Visit: Payer: Managed Care, Other (non HMO) | Admitting: Family Medicine

## 2014-06-19 ENCOUNTER — Ambulatory Visit: Payer: Managed Care, Other (non HMO) | Admitting: Family Medicine

## 2015-05-02 ENCOUNTER — Other Ambulatory Visit: Payer: Self-pay

## 2015-05-02 DIAGNOSIS — Z1231 Encounter for screening mammogram for malignant neoplasm of breast: Secondary | ICD-10-CM

## 2015-05-24 ENCOUNTER — Ambulatory Visit
Admission: RE | Admit: 2015-05-24 | Discharge: 2015-05-24 | Disposition: A | Discharge status: Home / Self Care | Payer: 59 | Source: Ambulatory Visit

## 2015-05-24 DIAGNOSIS — Z1231 Encounter for screening mammogram for malignant neoplasm of breast: Secondary | ICD-10-CM

## 2015-05-31 ENCOUNTER — Other Ambulatory Visit (HOSPITAL_COMMUNITY)
Admission: RE | Admit: 2015-05-31 | Discharge: 2015-05-31 | Disposition: A | Discharge status: Home / Self Care | Payer: BLUE CROSS/BLUE SHIELD | Source: Ambulatory Visit | Attending: Family Medicine | Admitting: Family Medicine

## 2015-05-31 ENCOUNTER — Encounter: Payer: Self-pay | Admitting: Family Medicine

## 2015-05-31 ENCOUNTER — Ambulatory Visit (INDEPENDENT_AMBULATORY_CARE_PROVIDER_SITE_OTHER): Payer: BLUE CROSS/BLUE SHIELD | Admitting: Family Medicine

## 2015-05-31 ENCOUNTER — Other Ambulatory Visit: Payer: Self-pay | Admitting: Family Medicine

## 2015-05-31 VITALS — BP 120/80 | HR 72 | Temp 98.2°F | Ht 60.0 in | Wt 126.0 lb

## 2015-05-31 DIAGNOSIS — R7303 Prediabetes: Secondary | ICD-10-CM | POA: Diagnosis not present

## 2015-05-31 DIAGNOSIS — Z1151 Encounter for screening for human papillomavirus (HPV): Secondary | ICD-10-CM | POA: Diagnosis not present

## 2015-05-31 DIAGNOSIS — Z0000 Encounter for general adult medical examination without abnormal findings: Secondary | ICD-10-CM | POA: Diagnosis not present

## 2015-05-31 DIAGNOSIS — Z01419 Encounter for gynecological examination (general) (routine) without abnormal findings: Secondary | ICD-10-CM | POA: Diagnosis present

## 2015-05-31 DIAGNOSIS — Z1159 Encounter for screening for other viral diseases: Secondary | ICD-10-CM

## 2015-05-31 DIAGNOSIS — Z13220 Encounter for screening for lipoid disorders: Secondary | ICD-10-CM | POA: Diagnosis not present

## 2015-05-31 LAB — LIPID PANEL
Cholesterol: 165 mg/dL (ref 0–200)
HDL: 68.1 mg/dL (ref >39.00)
LDL CALC: 84 mg/dL (ref 0–99)
NONHDL: 97.39
Total CHOL/HDL Ratio: 2
Triglycerides: 69 mg/dL (ref 0.0–149.0)
VLDL: 13.8 mg/dL (ref 0.0–40.0)

## 2015-05-31 LAB — COMPREHENSIVE METABOLIC PANEL
ALT: 13 U/L (ref 0–35)
AST: 17 U/L (ref 0–37)
Albumin: 4.5 g/dL (ref 3.5–5.2)
Alkaline Phosphatase: 61 U/L (ref 39–117)
BUN: 10 mg/dL (ref 6–23)
CHLORIDE: 102 meq/L (ref 96–112)
CO2: 32 meq/L (ref 19–32)
Calcium: 9.6 mg/dL (ref 8.4–10.5)
Creatinine, Ser: 0.64 mg/dL (ref 0.40–1.20)
GFR: 102.19 mL/min (ref >60.00)
Glucose, Bld: 90 mg/dL (ref 70–99)
POTASSIUM: 4 meq/L (ref 3.5–5.1)
SODIUM: 139 meq/L (ref 135–145)
Total Bilirubin: 0.5 mg/dL (ref 0.2–1.2)
Total Protein: 7 g/dL (ref 6.0–8.3)

## 2015-05-31 LAB — HEMOGLOBIN A1C: HEMOGLOBIN A1C: 6.4 % (ref 4.6–6.5)

## 2015-05-31 NOTE — Addendum Note (Signed)
Addended by: Sueanne MargaritaSMITH, DESHANNON L on: 05/31/2015 02:43 PM   Modules accepted: Orders

## 2015-05-31 NOTE — Assessment & Plan Note (Signed)
Due for re-eval. 

## 2015-05-31 NOTE — Progress Notes (Signed)
Pre visit review using our clinic review tool, if applicable. No additional management support is needed unless otherwise documented below in the visit note. 

## 2015-05-31 NOTE — Progress Notes (Signed)
55 year old female presents for annual wellness visit.  Labs reviewed in detail.   Husband now on disability for anxiety/panic disorder.  She has been occ anxious, mild depression but does not feel she needs to treat other then with stress reduction.   Prediabetes: Due for re-eval. Occ CBGs at 100-123. Exercise: None Diet: On the run, trying to improve.  Cholesterol at goal last check. LDL < 130  Due for re-eval.  No  OTC supplements.   Review of Systems  Constitutional: Negative for fever, fatigue and unexpected weight change.  HENT: Negative for ear pain, congestion, sore throat, sneezing, trouble swallowing and sinus pressure.  Eyes: Negative for pain and itching.  Respiratory: Negative for cough, shortness of breath and wheezing.  Cardiovascular: Negative for chest pain, palpitations and leg swelling.  Gastrointestinal: Negative for nausea, abdominal pain, diarrhea, constipation and blood in stool.  IBS flares occ, but controlled with diet.  Genitourinary: Negative for dysuria, hematuria, vaginal discharge, difficulty urinating and menstrual problem.  Skin: Negative for rash.  Neurological: Negative for syncope, weakness, light-headedness, numbness and headaches.  Psychiatric/Behavioral: Negative for confusion and dysphoric mood. The patient is not nervous/anxious.   Objective:   Physical Exam  Constitutional: Vital signs are normal. She appears well-developed and well-nourished. She is cooperative. Non-toxic appearance. She does not appear ill. No distress.  HENT:  Head: Normocephalic.  Right Ear: Hearing, tympanic membrane, external ear and ear canal normal.  Left Ear: Hearing, tympanic membrane, external ear and ear canal normal.  Nose: Nose normal.  Eyes: Conjunctivae, EOM and lids are normal. Pupils are equal, round, and reactive to light. No foreign bodies found.  Neck: Trachea normal and normal range of motion. Neck supple. Carotid bruit is not  present. No mass and no thyromegaly present.  Cardiovascular: Normal rate, regular rhythm, S1 normal, S2 normal, normal heart sounds and intact distal pulses. Exam reveals no gallop.  No murmur heard.  Pulmonary/Chest: Effort normal and breath sounds normal. No respiratory distress. She has no wheezes. She has no rhonchi. She has no rales.  Abdominal: Soft. Normal appearance and bowel sounds are normal. She exhibits no distension, no fluid wave, no abdominal bruit and no mass. There is no hepatosplenomegaly. There is no tenderness. There is no rebound, no guarding and no CVA tenderness. No hernia.  Genitourinary: Vagina normal and uterus normal. No breast swelling, tenderness, discharge or bleeding. Pelvic exam was performed with patient supine. There is no rash, tenderness or lesion on the right labia. There is no rash, tenderness or lesion on the left labia. Uterus is not enlarged and not tender.  Right adnexum displays no mass, no tenderness and no fullness. Left adnexum displays no mass, no tenderness and no fullness.  Papperformed.  Lymphadenopathy:  She has no cervical adenopathy.  She has no axillary adenopathy.  Neurological: She is alert. She has normal strength. No cranial nerve deficit or sensory deficit. Nml cerebellar exam.  Skin: Skin is warm, dry and intact. No rash.Psychiatric: Her speech is normal and behavior is normal. Judgment normal. Her mood appears not anxious. Cognition and memory are normal. She does not exhibit a depressed mood.   Assessment & Plan:   CPX: The patient's preventative maintenance and recommended screening tests for an annual wellness exam were reviewed in full today.  Brought up to date unless services declined.  Counselled on the importance of diet, exercise, and its role in overall health and mortality.  The patient's FH and SH was reviewed, including their  home life, tobacco status, and drug and alcohol status.   Vaccines: Uptodate with flu,  Tdap.  PAP/DVE: 2010, 2011.. 2013, DVE yearly, on q 3 year pap schedule Next due 2016 Mammo: 12/30 nml Colon: nml 05/2010, Dr. Juanda Chance, repeat in 10 years.  Non smoker      Hep C will do today. HIV: refused.

## 2015-05-31 NOTE — Patient Instructions (Addendum)
Work on time for your self, healthy low carb diet and regular exercise.  Stop at lab on way out.

## 2015-06-01 LAB — HEPATITIS C ANTIBODY: HCV AB: NEGATIVE

## 2015-06-05 LAB — CYTOLOGY - PAP

## 2015-06-06 ENCOUNTER — Encounter: Payer: Self-pay | Admitting: *Deleted

## 2015-07-12 ENCOUNTER — Encounter: Payer: Self-pay | Admitting: Family Medicine

## 2015-07-12 ENCOUNTER — Ambulatory Visit (INDEPENDENT_AMBULATORY_CARE_PROVIDER_SITE_OTHER): Payer: BLUE CROSS/BLUE SHIELD | Admitting: Family Medicine

## 2015-07-12 VITALS — BP 100/70 | HR 94 | Temp 98.3°F | Ht 60.0 in | Wt 123.8 lb

## 2015-07-12 DIAGNOSIS — J069 Acute upper respiratory infection, unspecified: Secondary | ICD-10-CM | POA: Insufficient documentation

## 2015-07-12 DIAGNOSIS — B9789 Other viral agents as the cause of diseases classified elsewhere: Principal | ICD-10-CM

## 2015-07-12 MED ORDER — GUAIFENESIN-CODEINE 100-10 MG/5ML PO SYRP: 5.0000 mL | ORAL_SOLUTION | Freq: Every evening | ORAL | Status: DC | PRN

## 2015-07-12 NOTE — Progress Notes (Signed)
Pre visit review using our clinic review tool, if applicable. No additional management support is needed unless otherwise documented below in the visit note. 

## 2015-07-12 NOTE — Assessment & Plan Note (Signed)
Healthy female, improving with time.  Rest, fluids, cough suppressant, symptomatic care.

## 2015-07-12 NOTE — Patient Instructions (Addendum)
Mucinex DM during the day.  Prescription cough supressant at night.  Rest, fluids.  Call if not continuing to feel better overall.

## 2015-07-12 NOTE — Progress Notes (Signed)
   Subjective:    Patient ID: Natalie Hudson, female    DOB: [birth year], 55 y.o.   MRN: [id number]  Cough This is a new problem. The current episode started 1 to 4 weeks ago. The problem has been gradually worsening. The problem occurs constantly. The cough is non-productive. Associated symptoms include chills, ear congestion, a fever, headaches, myalgias, postnasal drip, a sore throat and shortness of breath. Pertinent negatives include no ear pain, nasal congestion, rash or wheezing. Associated symptoms comments: Fatigue, weakness  fever 100 F 1 week ago  cramping in feet, now gone  occ soreness after coughing in left chest wall, gone now.. The symptoms are aggravated by lying down (keeping her up at night). Risk factors: nonsmoker. Treatments tried: ibuprofen for fever, tried allegra D, alkaseltzer cold/cough, mucinex DM. The treatment provided mild relief. Her past medical history is significant for environmental allergies. There is no history of asthma, bronchiectasis, bronchitis, COPD or emphysema.  Extremity Weakness  Associated symptoms include a fever.  Shortness of Breath Associated symptoms include a fever, headaches and a sore throat. Pertinent negatives include no ear pain, rash or wheezing. There is no history of asthma or COPD.   Social History /Family History/Past Medical History reviewed and updated if needed.    Review of Systems  Constitutional: Positive for fever and chills.  HENT: Positive for postnasal drip and sore throat. Negative for ear pain.   Respiratory: Positive for cough and shortness of breath. Negative for wheezing.   Musculoskeletal: Positive for myalgias and extremity weakness.  Skin: Negative for rash.  Allergic/Immunologic: Positive for environmental allergies.  Neurological: Positive for headaches.       Objective:   Physical Exam  Constitutional: Vital signs are normal. She appears well-developed and well-nourished. She is cooperative.  Non-toxic  appearance. She does not appear ill. No distress.  HENT:  Head: Normocephalic.  Right Ear: Hearing, tympanic membrane, external ear and ear canal normal. Tympanic membrane is not erythematous, not retracted and not bulging.  Left Ear: Hearing, tympanic membrane, external ear and ear canal normal. Tympanic membrane is not erythematous, not retracted and not bulging.  Nose: Mucosal edema and rhinorrhea present. Right sinus exhibits no maxillary sinus tenderness and no frontal sinus tenderness. Left sinus exhibits no maxillary sinus tenderness and no frontal sinus tenderness.  Mouth/Throat: Uvula is midline, oropharynx is clear and moist and mucous membranes are normal.  Eyes: Conjunctivae, EOM and lids are normal. Pupils are equal, round, and reactive to light. Lids are everted and swept, no foreign bodies found.  Neck: Trachea normal and normal range of motion. Neck supple. Carotid bruit is not present. No thyroid mass and no thyromegaly present.  Cardiovascular: Normal rate, regular rhythm, S1 normal, S2 normal, normal heart sounds, intact distal pulses and normal pulses.  Exam reveals no gallop and no friction rub.   No murmur heard. Pulmonary/Chest: Effort normal and breath sounds normal. No tachypnea. No respiratory distress. She has no decreased breath sounds. She has no wheezes. She has no rhonchi. She has no rales.  Neurological: She is alert.  Skin: Skin is warm, dry and intact. No rash noted.  Psychiatric: Her speech is normal and behavior is normal. Judgment normal. Her mood appears not anxious. Cognition and memory are normal. She does not exhibit a depressed mood.          Assessment & Plan:

## 2016-06-16 DIAGNOSIS — Z23 Encounter for immunization: Secondary | ICD-10-CM | POA: Diagnosis not present

## 2016-07-21 ENCOUNTER — Other Ambulatory Visit: Payer: Self-pay | Admitting: Family Medicine

## 2016-07-21 DIAGNOSIS — Z1231 Encounter for screening mammogram for malignant neoplasm of breast: Secondary | ICD-10-CM

## 2016-08-06 ENCOUNTER — Ambulatory Visit
Admission: RE | Admit: 2016-08-06 | Discharge: 2016-08-06 | Disposition: A | Discharge status: Home / Self Care | Payer: BLUE CROSS/BLUE SHIELD | Source: Ambulatory Visit | Attending: Family Medicine | Admitting: Family Medicine

## 2016-08-06 DIAGNOSIS — Z1231 Encounter for screening mammogram for malignant neoplasm of breast: Secondary | ICD-10-CM | POA: Diagnosis not present

## 2016-10-20 ENCOUNTER — Encounter: Payer: Self-pay | Admitting: Family Medicine

## 2016-10-21 ENCOUNTER — Encounter: Payer: Self-pay | Admitting: Family Medicine

## 2017-04-11 DIAGNOSIS — Z23 Encounter for immunization: Secondary | ICD-10-CM | POA: Diagnosis not present

## 2017-04-28 ENCOUNTER — Encounter: Payer: Self-pay | Admitting: Family Medicine

## 2017-04-28 ENCOUNTER — Ambulatory Visit: Payer: BLUE CROSS/BLUE SHIELD | Admitting: Family Medicine

## 2017-04-28 VITALS — BP 106/68 | HR 89 | Temp 98.3°F | Wt 126.8 lb

## 2017-04-28 DIAGNOSIS — J22 Unspecified acute lower respiratory infection: Secondary | ICD-10-CM | POA: Diagnosis not present

## 2017-04-28 MED ORDER — GUAIFENESIN-CODEINE 100-10 MG/5ML PO SYRP: 5.0000 mL | ORAL_SOLUTION | Freq: Every evening | ORAL | 0 refills | Status: DC | PRN

## 2017-04-28 MED ORDER — AMOXICILLIN 875 MG PO TABS: 875.0000 mg | ORAL_TABLET | Freq: Two times a day (BID) | ORAL | 0 refills | Status: DC

## 2017-04-28 NOTE — Patient Instructions (Signed)
Please take mucinex to thin your secretions  Delsym for daytime cough- generic is fine for both

## 2017-04-28 NOTE — Progress Notes (Signed)
   Subjective:    Patient ID: Natalie Hudson, female    DOB: [birth year], 56 y.o.   MRN: [id number]  HPI This is a 56 yo female who presents today with cough, scratchy throat for a week. Coughing up thick green sputum, subjective fever/chills. Little nasal drainage, little sinus pressure, hoarse, no ear pain. Feels mildly SOB, no wheeze. Has been taking Tussin DM, honey, hot tea, ibuprofen 2 qhs. Coughing all night. Left sided rib pain. No nausea or vomiting. Had flu vaccine.   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   Past Medical History:  Diagnosis Date  . Allergy   . Anxiety   . GERD (gastroesophageal reflux disease)   . Hemorrhoid   . Hiatal hernia   . IBS (irritable bowel syndrome)   . Spherocytosis, hereditary [facility](HCC)    Past Surgical History:  Procedure Laterality Date  . COSMETIC SURGERY    . HERNIA REPAIR    . TUBAL LIGATION     Family History  Problem Relation Age of Onset  . Diabetes Mother   . Diabetes Sister    Social History   Tobacco Use  . Smoking status: Never Smoker  . Smokeless tobacco: Never Used  Substance Use Topics  . Alcohol use: No  . Drug use: No      Review of Systems Per HPI    Objective:   Physical Exam  Constitutional: She is oriented to person, place, and time. She appears well-developed and well-nourished. She appears ill. No distress.  HENT:  Head: Normocephalic and atraumatic.  Right Ear: Tympanic membrane, external ear and ear canal normal.  Left Ear: Tympanic membrane, external ear and ear canal normal.  Nose: Mucosal edema and rhinorrhea present.  Mouth/Throat: Uvula is midline. Posterior oropharyngeal erythema present. No oropharyngeal exudate or posterior oropharyngeal edema.  Eyes: Conjunctivae are normal.  Neck: Normal range of motion. Neck supple.  Cardiovascular: Normal rate, regular rhythm and normal heart sounds.  Pulmonary/Chest: Effort normal and breath sounds normal.  Lymphadenopathy:    She has no cervical  adenopathy.  Neurological: She is alert and oriented to person, place, and time.  Skin: Skin is warm and dry. She is not diaphoretic.  Psychiatric: She has a normal mood and affect. Her behavior is normal. Judgment and thought content normal.  Vitals reviewed.     BP 106/68 (BP Location: Right Arm, Patient Position: Sitting, Cuff Size: Normal)   Pulse 89   Temp 98.3 F (36.8 C) (Oral)   Wt 126 lb 12 oz (57.5 kg)   SpO2 97%   BMI 24.75 kg/m  Wt Readings from Last 3 Encounters:  04/28/17 126 lb 12 oz (57.5 kg)  07/12/15 123 lb 12 oz (56.1 kg)  05/31/15 126 lb (57.2 kg)       Assessment & Plan:  1. Lower respiratory infection - Provided written and verbal information regarding diagnosis and treatment, RTC/ER precautions.  -  Patient Instructions  Please take mucinex to thin your secretions  Delsym for daytime cough- generic is fine for both    - amoxicillin (AMOXIL) 875 MG tablet; Take 1 tablet (875 mg total) by mouth 2 (two) times daily.  Dispense: 14 tablet; Refill: 0 - guaiFENesin-codeine (ROBITUSSIN AC) 100-10 MG/5ML syrup; Take 5-10 mLs by mouth at bedtime as needed for cough.  Dispense: 180 mL; Refill: 0   Olean Reeeborah Gessner, FNP-BC  [facility]  05/02/2017 10:55 AM

## 2017-05-02 ENCOUNTER — Encounter: Payer: Self-pay | Admitting: Family Medicine

## 2017-10-15 ENCOUNTER — Ambulatory Visit: Payer: BLUE CROSS/BLUE SHIELD | Admitting: Family Medicine

## 2017-10-15 ENCOUNTER — Other Ambulatory Visit: Payer: Self-pay

## 2017-10-15 ENCOUNTER — Encounter: Payer: Self-pay | Admitting: Family Medicine

## 2017-10-15 VITALS — BP 109/77 | HR 78 | Temp 97.9°F | Ht 59.5 in | Wt 127.2 lb

## 2017-10-15 DIAGNOSIS — R590 Localized enlarged lymph nodes: Secondary | ICD-10-CM | POA: Diagnosis not present

## 2017-10-15 MED ORDER — AMOXICILLIN 500 MG PO CAPS: 1000.0000 mg | ORAL_CAPSULE | Freq: Two times a day (BID) | ORAL | 0 refills | Status: DC

## 2017-10-15 MED ORDER — CLOBETASOL PROPIONATE 0.05 % EX CREA
1.0000 | TOPICAL_CREAM | Freq: Two times a day (BID) | CUTANEOUS | 0 refills | Status: DC
Start: 2017-10-15 — End: 2017-10-19

## 2017-10-15 NOTE — Progress Notes (Signed)
   Subjective:    Patient ID: Natalie Hudson, female    DOB: Sep 04, 1960, 57 y.o.   MRN: [id number]  HPI    57 year old female presents with new onset  knot on  Left side of neck.  She reports first noting the area yesterday.  Oblong 2 cm long.  Not sore.   No new  change in size.  No redness.   She has some post nasal drip and allergies, feels well.  Has itchy eye and itchy nose.  Not taking allergy med.. Does not bother her much.   no ear pain, no ST, no skin rash, no dental pain.  no SOB, no CP.   no flu likel    Review of Systems     Objective:   Physical Exam        Assessment & Plan:

## 2017-10-15 NOTE — Patient Instructions (Signed)
Start flonase 2 sprays per nostril daily. Can try zyrtec at bedtime  Complete course of amoxicillin x 10 days.  If not improving knot.. Return in 1 months.  Call sooner if new fever, unexpected weight loss, night sweats.

## 2017-10-19 ENCOUNTER — Telehealth: Payer: Self-pay | Admitting: *Deleted

## 2017-10-19 NOTE — Telephone Encounter (Signed)
Okay to change to Clobetasol 0.05% emollient ointment

## 2017-10-19 NOTE — Telephone Encounter (Signed)
Received fax from Karin Golden requesting PA for Clobetasol Cream.  PA completed on CoverMyMeds.  PA was sent for review.  It can take up to 72 hours for a response.

## 2017-10-19 NOTE — Telephone Encounter (Addendum)
PA denied. Patient must have tried and failed two of the following alternatives. Alternative medications: Augmented betamethasone 0.05%, Clobetasol 0.05% emollient-cream/ointment/shampoo/solution, Halobetasol 0.05% cream/ointment.  Patient notified by telephone.  She is okay changing prescription to ointment.    Ok to send in new Rx for ointment?

## 2017-10-19 NOTE — Addendum Note (Signed)
Addended by: Damita Lack on: 10/19/2017 03:29 PM   Modules accepted: Orders

## 2017-10-20 MED ORDER — CLOBETASOL PROPIONATE 0.05 % EX OINT: 1.0000 "application " | TOPICAL_OINTMENT | Freq: Two times a day (BID) | CUTANEOUS | 0 refills | Status: DC

## 2017-10-20 NOTE — Addendum Note (Signed)
Addended by: Damita Lack on: 10/20/2017 08:11 AM   Modules accepted: Orders

## 2018-01-06 ENCOUNTER — Other Ambulatory Visit: Payer: Self-pay | Admitting: Family Medicine

## 2018-01-06 DIAGNOSIS — Z1231 Encounter for screening mammogram for malignant neoplasm of breast: Secondary | ICD-10-CM

## 2018-02-04 ENCOUNTER — Ambulatory Visit
Admission: RE | Admit: 2018-02-04 | Discharge: 2018-02-04 | Disposition: A | Discharge status: Home / Self Care | Payer: BLUE CROSS/BLUE SHIELD | Source: Ambulatory Visit | Attending: Family Medicine | Admitting: Family Medicine

## 2018-02-04 DIAGNOSIS — Z1231 Encounter for screening mammogram for malignant neoplasm of breast: Secondary | ICD-10-CM

## 2018-07-04 ENCOUNTER — Other Ambulatory Visit: Payer: Self-pay | Admitting: Specialist

## 2018-07-04 ENCOUNTER — Ambulatory Visit: Payer: BLUE CROSS/BLUE SHIELD

## 2018-07-04 DIAGNOSIS — R188 Other ascites: Secondary | ICD-10-CM

## 2019-02-01 ENCOUNTER — Ambulatory Visit (INDEPENDENT_AMBULATORY_CARE_PROVIDER_SITE_OTHER): Payer: BC Managed Care – PPO | Admitting: Family Medicine

## 2019-02-01 ENCOUNTER — Encounter: Payer: Self-pay | Admitting: Family Medicine

## 2019-02-01 ENCOUNTER — Other Ambulatory Visit: Payer: Self-pay

## 2019-02-01 VITALS — Temp 99.8°F | Wt 127.0 lb

## 2019-02-01 DIAGNOSIS — J069 Acute upper respiratory infection, unspecified: Secondary | ICD-10-CM | POA: Diagnosis not present

## 2019-02-01 DIAGNOSIS — B9789 Other viral agents as the cause of diseases classified elsewhere: Secondary | ICD-10-CM

## 2019-02-01 DIAGNOSIS — R6889 Other general symptoms and signs: Secondary | ICD-10-CM | POA: Diagnosis not present

## 2019-02-01 DIAGNOSIS — Z20822 Contact with and (suspected) exposure to covid-19: Secondary | ICD-10-CM

## 2019-02-01 NOTE — Progress Notes (Signed)
Virtual Visit via Video Note  I connected with Natalie Hudson on 02/01/19 at 11:00 AM EDT by a video enabled telemedicine application and verified that I am speaking with the correct person using two identifiers.  Location: Patient: at her home Natalie Hudson, also present, A/GNP student Natalie Hudson   I discussed the limitations of evaluation and management by telemedicine and the availability of in person appointments. The patient expressed understanding and agreed to proceed.  History of Present Illness: Chief Complaint  Patient presents with  . Cough    head congestion, nasal congestion, right eye matted. symptoms started over the weekend but got worse as of yesterday-01/31/2019.   She reports that she gets similar symptoms yearly. 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 No known covid exposures.  Has been taking tylenol with some relief. No wheeze or SOB. Little nasal drainage, some color, + post nasal drainage, cough with yellow/green/creamy sputum, no wheeze or SOB. Right eye matting last night, no drainage or redness this am.   Past Medical History:  Diagnosis Date  . Allergy   . Anxiety   . GERD (gastroesophageal reflux disease)   . Hemorrhoid   . Hiatal hernia   . IBS (irritable bowel syndrome)   . Spherocytosis, hereditary [facility])    Past Surgical History:  Procedure Laterality Date  . COSMETIC SURGERY    . HERNIA REPAIR    . TUBAL LIGATION     Family History  Problem Relation Age of Onset  . Diabetes Mother   . Diabetes Sister    Social History   Tobacco Use  . Smoking status: Never Smoker  . Smokeless tobacco: Never Used  Substance Use Topics  . Alcohol use: No  . Drug use: No      Observations/Objective: Patient is alert and answers questions appropriately. Visible skin is unremarkable, conjunctiva clear without drainage. Audible nasal congestion. Respirations even and  unlabored. No audible wheeze or witnessed cough. Mood and affect appropriate.   Temp 99.8 F (37.7 C) Comment: per patient  Wt 127 lb (57.6 kg) Comment: per patient  BMI 25.22 kg/m   Assessment and Plan: 1. Viral URI with cough - we discussed getting Covid test today and she is agreeable - reviewed symptomatic treatment and instructions sent to patient via mychart - RTC/ER precautions reviewed - she agreed to isolate until results available  Clarene Reamer, FNP-BC  [facility] at [facility], [place]  02/01/2019 11:05 AM   Follow Up Instructions: Sent to patient via mychart   I discussed the assessment and treatment plan with the patient. The patient was provided an opportunity to ask questions and all were answered. The patient agreed with the plan and demonstrated an understanding of the instructions.   The patient was advised to call back or seek an in-person evaluation if the symptoms worsen or if the condition fails to improve as anticipated.   Elby Beck, FNP

## 2019-02-03 LAB — NOVEL CORONAVIRUS, NAA: SARS-CoV-2, NAA: NOT DETECTED

## 2019-02-20 ENCOUNTER — Encounter: Payer: Self-pay | Admitting: Family Medicine

## 2019-03-07 ENCOUNTER — Other Ambulatory Visit: Payer: Self-pay | Admitting: Family Medicine

## 2019-03-07 DIAGNOSIS — Z1231 Encounter for screening mammogram for malignant neoplasm of breast: Secondary | ICD-10-CM

## 2019-03-14 ENCOUNTER — Ambulatory Visit
Admission: RE | Admit: 2019-03-14 | Discharge: 2019-03-14 | Disposition: A | Discharge status: Home / Self Care | Payer: BC Managed Care – PPO | Source: Ambulatory Visit | Attending: Family Medicine | Admitting: Family Medicine

## 2019-03-14 ENCOUNTER — Other Ambulatory Visit: Payer: Self-pay

## 2019-03-14 DIAGNOSIS — Z1231 Encounter for screening mammogram for malignant neoplasm of breast: Secondary | ICD-10-CM

## 2019-03-16 ENCOUNTER — Ambulatory Visit: Payer: BC Managed Care – PPO

## 2019-05-17 ENCOUNTER — Ambulatory Visit: Payer: BC Managed Care – PPO | Attending: Internal Medicine

## 2019-05-17 DIAGNOSIS — Z20822 Contact with and (suspected) exposure to covid-19: Secondary | ICD-10-CM

## 2019-05-17 DIAGNOSIS — Z20828 Contact with and (suspected) exposure to other viral communicable diseases: Secondary | ICD-10-CM | POA: Diagnosis not present

## 2019-05-19 LAB — NOVEL CORONAVIRUS, NAA: SARS-CoV-2, NAA: DETECTED — AB

## 2019-05-22 ENCOUNTER — Telehealth: Payer: Self-pay | Admitting: Family Medicine

## 2019-05-22 NOTE — Telephone Encounter (Signed)
Appointment 12/29 

## 2019-05-22 NOTE — Telephone Encounter (Signed)
Patient stated she she received her covid results on the 25th and they were positive. When she received her test results they advised her to call her primary care to see if a medication could be prescribed for her cough.   The patient stated she has a cough that has been around for 3 months. The over the counter medications have not completely cleared the cough. She would like to know if a prescription could be sent into her pharmacy to help with this.

## 2019-05-22 NOTE — Telephone Encounter (Signed)
Please call and schedule appointment as instructed. 

## 2019-05-22 NOTE — Telephone Encounter (Signed)
Have pt make virtual OV to discuss meds for cough.

## 2019-05-23 ENCOUNTER — Ambulatory Visit (INDEPENDENT_AMBULATORY_CARE_PROVIDER_SITE_OTHER): Payer: BC Managed Care – PPO | Admitting: Family Medicine

## 2019-05-23 ENCOUNTER — Other Ambulatory Visit: Payer: Self-pay

## 2019-05-23 ENCOUNTER — Encounter: Payer: Self-pay | Admitting: Family Medicine

## 2019-05-23 DIAGNOSIS — U071 COVID-19: Secondary | ICD-10-CM | POA: Diagnosis not present

## 2019-05-23 MED ORDER — AZITHROMYCIN 250 MG PO TABS: ORAL_TABLET | ORAL | 0 refills | Status: DC

## 2019-05-23 MED ORDER — PREDNISONE 20 MG PO TABS: ORAL_TABLET | ORAL | 0 refills | Status: DC

## 2019-05-23 NOTE — Assessment & Plan Note (Signed)
Pt with COVID19. Symptomatic care as pt not high risk.,   She has had continued cough since 01/2019... possible allergies , pt concerned about bacterial superinfection.  Treat post infectious cough and bronchospasm with prednisone taper.  Add Xyzal for allergy component.   igf not improving as expected.. pt can try course of antibiotics for possible bacterial superinfeciton.  If worsening pt will need in person exam and possible CXR.

## 2019-05-23 NOTE — Progress Notes (Signed)
VIRTUAL VISIT Due to national recommendations of social distancing due to [place] 19, a virtual visit is felt to be most appropriate for this patient at this time.   I connected with the patient on 05/23/19 at 10:40 AM EST by virtual telehealth platform and verified that I am speaking with the correct person using two identifiers.   I discussed the limitations, risks, security and privacy concerns of performing an evaluation and management service by  virtual telehealth platform and the availability of in person appointments. I also discussed with the patient that there may be a patient responsible charge related to this service. The patient expressed understanding and agreed to proceed.  Patient location: Home Provider Location: [facility] Participants: Eliezer Lofts and Joselyn Glassman   Chief Complaint  Patient presents with  . Cough    Since September-Tested positive for Covid on 05/17/2019    History of Present Illness:  58 year old female with history of allergic rhinitis presents with cough, positive COVID test on 05/17/2019.  She reports she has been having a productive cough since September. Saw NP .. treated with OTC meds. Neg for COVID  In [place].  Now exposed to [place] on 05/13/2019.Marland Kitchen  Cough changed to be dry and wet cough, mild SOB positive test on 12/23.  Has some face  Pain, burning sensation around nose and eye.  Fever.. low grade.  Has post nasal drip ongoing all along.    She has tried tylenol for symptoms. Delsym, OTC zyrtec did not help in past.  Flonase made her nose start bleeding. .No wheeze.  COVID-19 virus infection  Pt with COVID19. Symptomatic care as pt not high risk.,   She has had continued cough since 01/2019... possible allergies , pt concerned about bacterial superinfection.  Treat post infectious cough and bronchospasm with prednisone taper.  Add Xyzal for allergy component.   igf not improving as expected.. pt can try course of  antibiotics for possible bacterial superinfeciton.  If worsening pt will need in person exam and possible CXR.     COVID 19 screen No recent travel or known exposure to COVID19 The patient denies respiratory symptoms of COVID 19 at this time.  The importance of social distancing was discussed today.   ROS    Past Medical History:  Diagnosis Date  . Allergy   . Anxiety   . GERD (gastroesophageal reflux disease)   . Hemorrhoid   . Hiatal hernia   . IBS (irritable bowel syndrome)   . Spherocytosis, hereditary ([place])     reports that she has never smoked. She has never used smokeless tobacco. She reports that she does not drink alcohol or use drugs.   Current Outpatient Medications:  .  Ascorbic Acid (VITAMIN C) 1000 MG tablet, Take 1,000 mg by mouth daily., Disp: , Rfl:  .  BIOTIN PO, Take 1 tablet by mouth. two to three x week, Disp: , Rfl:  .  clobetasol ointment (TEMOVATE) 3.54 %, Apply 1 application topically 2 (two) times daily., Disp: 30 g, Rfl: 0 .  ELDERBERRY PO, Take 1 tablet by mouth daily., Disp: , Rfl:  .  famotidine (PEPCID) 10 MG tablet, Take 10 mg by mouth 2 (two) times daily., Disp: , Rfl:  .  zinc gluconate 50 MG tablet, Take 50 mg by mouth daily., Disp: , Rfl:    Observations/Objective: Temperature 99.8 F (37.7 C), temperature source Temporal, height 4' 11.5" (1.511 m).  Physical Exam   Assessment and Plan  I discussed the assessment and treatment plan with the patient. The patient was provided an opportunity to ask questions and all were answered. The patient agreed with the plan and demonstrated an understanding of the instructions.   The patient was advised to call back or seek an in-person evaluation if the symptoms worsen or if the condition fails to improve as anticipated.     Eliezer Lofts, MD

## 2019-05-31 ENCOUNTER — Ambulatory Visit: Payer: PRIVATE HEALTH INSURANCE | Attending: Internal Medicine

## 2019-05-31 DIAGNOSIS — Z20822 Contact with and (suspected) exposure to covid-19: Secondary | ICD-10-CM

## 2019-06-02 LAB — NOVEL CORONAVIRUS, NAA: SARS-CoV-2, NAA: DETECTED — AB

## 2019-09-05 ENCOUNTER — Other Ambulatory Visit: Payer: Self-pay

## 2019-09-05 ENCOUNTER — Encounter: Payer: Self-pay | Admitting: Family Medicine

## 2019-09-05 ENCOUNTER — Ambulatory Visit: Payer: 59 | Admitting: Family Medicine

## 2019-09-05 VITALS — BP 118/70 | HR 88 | Temp 98.3°F | Ht 59.5 in | Wt 119.0 lb

## 2019-09-05 DIAGNOSIS — R1013 Epigastric pain: Secondary | ICD-10-CM | POA: Diagnosis not present

## 2019-09-05 DIAGNOSIS — R0789 Other chest pain: Secondary | ICD-10-CM

## 2019-09-05 LAB — CBC WITH DIFFERENTIAL/PLATELET
Basophils Absolute: 0 10*3/uL (ref 0.0–0.1)
Basophils Relative: 0.6 % (ref 0.0–3.0)
Eosinophils Absolute: 0.1 10*3/uL (ref 0.0–0.7)
Eosinophils Relative: 1.2 % (ref 0.0–5.0)
HCT: 38.4 % (ref 36.0–46.0)
Hemoglobin: 13.1 g/dL (ref 12.0–15.0)
Lymphocytes Relative: 32.7 % (ref 12.0–46.0)
Lymphs Abs: 2.2 10*3/uL (ref 0.7–4.0)
MCHC: 34 g/dL (ref 30.0–36.0)
MCV: 89.9 fl (ref 78.0–100.0)
Monocytes Absolute: 0.5 10*3/uL (ref 0.1–1.0)
Monocytes Relative: 7.7 % (ref 3.0–12.0)
Neutro Abs: 3.9 10*3/uL (ref 1.4–7.7)
Neutrophils Relative %: 57.8 % (ref 43.0–77.0)
Platelets: 223 10*3/uL (ref 150.0–400.0)
RBC: 4.28 Mil/uL (ref 3.87–5.11)
RDW: 13 % (ref 11.5–15.5)
WBC: 6.7 10*3/uL (ref 4.0–10.5)

## 2019-09-05 LAB — LIPASE: Lipase: 12 U/L (ref 11.0–59.0)

## 2019-09-05 LAB — COMPREHENSIVE METABOLIC PANEL
ALT: 12 U/L (ref 0–35)
AST: 14 U/L (ref 0–37)
Albumin: 4.5 g/dL (ref 3.5–5.2)
Alkaline Phosphatase: 64 U/L (ref 39–117)
BUN: 14 mg/dL (ref 6–23)
CO2: 29 mEq/L (ref 19–32)
Calcium: 9.5 mg/dL (ref 8.4–10.5)
Chloride: 102 mEq/L (ref 96–112)
Creatinine, Ser: 0.63 mg/dL (ref 0.40–1.20)
GFR: 96.45 mL/min (ref >60.00)
Glucose, Bld: 119 mg/dL — ABNORMAL HIGH (ref 70–99)
Potassium: 4.1 mEq/L (ref 3.5–5.1)
Sodium: 136 mEq/L (ref 135–145)
Total Bilirubin: 0.5 mg/dL (ref 0.2–1.2)
Total Protein: 7.1 g/dL (ref 6.0–8.3)

## 2019-09-05 NOTE — Patient Instructions (Addendum)
Please stop at the lab to have labs drawn. Start omeprazole 20 mg daily x 2-4 weeks.  Avoid acid triggers.  Start chest wall stretches.

## 2019-09-05 NOTE — Progress Notes (Signed)
Chief Complaint  Patient presents with  . Abdominal Pain    x3-4 weeks, left side upper abdominal pain    History of Present Illness: HPI    59 year old female with history of IBS presents with new onset pain in left upper abdomen x 3-4 weeks.Marland Kitchen occ radiates to LLQ and mid back.  Pain is intermittently sharp and momentary.Marland Kitchen always constant milder pain.  Dull ache pain... 4-5/10.  Moving in certain was hurt more.  Occ worse with eating. This does not feel like her IBS.   burning across upper abdomen inlast few days.. heartburn, She stopped famotidine in last few n=months. No NV/C/C. No blood in stool. No fever.  no dysuria.  alkaseltzer antacid, did not help.   Colonoscopy 2012 NO Tics.  She has been under a lto of stress in last several months. Father has cancer.  This visit occurred during the SARS-CoV-2 public health emergency.  Safety protocols were in place, including screening questions prior to the visit, additional usage of staff PPE, and extensive cleaning of exam room while observing appropriate contact time as indicated for disinfecting solutions.   COVID 19 screen:  No recent travel or known exposure to COVID19 The patient denies respiratory symptoms of COVID 19 at this time. The importance of social distancing was discussed today.     Review of Systems  Constitutional: Negative for chills and fever.  HENT: Negative for congestion and ear pain.   Eyes: Negative for pain and redness.  Respiratory: Negative for cough and shortness of breath.   Cardiovascular: Negative for chest pain, palpitations and leg swelling.  Gastrointestinal: Positive for abdominal pain. Negative for blood in stool, constipation, diarrhea, nausea and vomiting.  Genitourinary: Negative for dysuria.  Musculoskeletal: Negative for falls and myalgias.  Skin: Negative for rash.  Neurological: Negative for dizziness.  Psychiatric/Behavioral: Negative for depression. The patient is not  nervous/anxious.       Past Medical History:  Diagnosis Date  . Allergy   . Anxiety   . GERD (gastroesophageal reflux disease)   . Hemorrhoid   . Hiatal hernia   . IBS (irritable bowel syndrome)   . Spherocytosis, hereditary (HCC)     reports that she has never smoked. She has never used smokeless tobacco. She reports that she does not drink alcohol or use drugs.   Current Outpatient Medications:  .  Ascorbic Acid (VITAMIN C) 1000 MG tablet, Take 1,000 mg by mouth daily., Disp: , Rfl:  .  BIOTIN PO, Take 1 tablet by mouth. two to three x week, Disp: , Rfl:  .  clobetasol ointment (TEMOVATE) 0.05 %, Apply 1 application topically 2 (two) times daily. (Patient not taking: Reported on 09/05/2019), Disp: 30 g, Rfl: 0 .  ELDERBERRY PO, Take 1 tablet by mouth daily., Disp: , Rfl:  .  famotidine (PEPCID) 10 MG tablet, Take 10 mg by mouth 2 (two) times daily., Disp: , Rfl:  .  zinc gluconate 50 MG tablet, Take 50 mg by mouth daily., Disp: , Rfl:    Observations/Objective: Blood pressure 118/70, pulse 88, temperature 98.3 F (36.8 C), temperature source Temporal, height 4' 11.5" (1.511 m), weight 119 lb (54 kg), SpO2 98 %.  Physical Exam Constitutional:      General: She is not in acute distress.    Appearance: Normal appearance. She is well-developed. She is not ill-appearing or toxic-appearing.  HENT:     Head: Normocephalic.     Right Ear: Hearing, tympanic membrane, ear canal and  external ear normal. Tympanic membrane is not erythematous, retracted or bulging.     Left Ear: Hearing, tympanic membrane, ear canal and external ear normal. Tympanic membrane is not erythematous, retracted or bulging.     Nose: No mucosal edema or rhinorrhea.     Right Sinus: No maxillary sinus tenderness or frontal sinus tenderness.     Left Sinus: No maxillary sinus tenderness or frontal sinus tenderness.     Mouth/Throat:     Pharynx: Uvula midline.  Eyes:     General: Lids are normal. Lids are  everted, no foreign bodies appreciated.     Conjunctiva/sclera: Conjunctivae normal.     Pupils: Pupils are equal, round, and reactive to light.  Neck:     Thyroid: No thyroid mass or thyromegaly.     Vascular: No carotid bruit.     Trachea: Trachea normal.  Cardiovascular:     Rate and Rhythm: Normal rate and regular rhythm.     Pulses: Normal pulses.     Heart sounds: Normal heart sounds, S1 normal and S2 normal. No murmur. No friction rub. No gallop.   Pulmonary:     Effort: Pulmonary effort is normal. No tachypnea or respiratory distress.     Breath sounds: Normal breath sounds. No decreased breath sounds, wheezing, rhonchi or rales.  Chest:     Comments: ttp over anterior chest wall.. nonfocal Abdominal:     General: Bowel sounds are normal.     Palpations: Abdomen is soft.     Tenderness: There is abdominal tenderness in the epigastric area. There is no right CVA tenderness, left CVA tenderness, guarding or rebound.  Musculoskeletal:     Cervical back: Normal range of motion and neck supple.  Skin:    General: Skin is warm and dry.     Findings: No rash.  Neurological:     Mental Status: She is alert.  Psychiatric:        Mood and Affect: Mood is not anxious or depressed.        Speech: Speech normal.        Behavior: Behavior normal. Behavior is cooperative.        Thought Content: Thought content normal.        Judgment: Judgment normal.      Assessment and Plan      Eliezer Lofts, MD

## 2019-09-20 DIAGNOSIS — R1013 Epigastric pain: Secondary | ICD-10-CM | POA: Insufficient documentation

## 2019-09-20 DIAGNOSIS — R0789 Other chest pain: Secondary | ICD-10-CM | POA: Insufficient documentation

## 2019-09-20 NOTE — Assessment & Plan Note (Signed)
Start gentle stretching.

## 2019-09-20 NOTE — Assessment & Plan Note (Signed)
Eval with labs. Start omeprazole 20 mg daily x 2-4 weeks.  Avoid acid triggers.

## 2019-10-06 MED ORDER — HYDROXYZINE HCL 10 MG PO TABS: 10.0000 mg | ORAL_TABLET | Freq: Three times a day (TID) | ORAL | 0 refills | Status: DC | PRN

## 2020-01-01 LAB — HM DIABETES FOOT EXAM

## 2020-01-18 ENCOUNTER — Telehealth: Payer: Self-pay | Admitting: Family Medicine

## 2020-01-18 DIAGNOSIS — R7303 Prediabetes: Secondary | ICD-10-CM

## 2020-01-18 NOTE — Telephone Encounter (Signed)
-----   Message from Alvina Chou sent at 01/02/2020  2:38 PM EDT ----- Regarding: lab orders for Friday,8.27.21 Patient is scheduled for CPX labs, please order future labs, Thanks , Camelia Eng

## 2020-01-19 ENCOUNTER — Other Ambulatory Visit: Payer: Self-pay

## 2020-01-19 ENCOUNTER — Other Ambulatory Visit (INDEPENDENT_AMBULATORY_CARE_PROVIDER_SITE_OTHER): Payer: 59

## 2020-01-19 DIAGNOSIS — R7303 Prediabetes: Secondary | ICD-10-CM

## 2020-01-19 LAB — COMPREHENSIVE METABOLIC PANEL
ALT: 14 U/L (ref 0–35)
AST: 16 U/L (ref 0–37)
Albumin: 4.4 g/dL (ref 3.5–5.2)
Alkaline Phosphatase: 63 U/L (ref 39–117)
BUN: 16 mg/dL (ref 6–23)
CO2: 31 mEq/L (ref 19–32)
Calcium: 9.4 mg/dL (ref 8.4–10.5)
Chloride: 101 mEq/L (ref 96–112)
Creatinine, Ser: 0.69 mg/dL (ref 0.40–1.20)
GFR: 86.73 mL/min (ref >60.00)
Glucose, Bld: 140 mg/dL — ABNORMAL HIGH (ref 70–99)
Potassium: 3.9 mEq/L (ref 3.5–5.1)
Sodium: 138 mEq/L (ref 135–145)
Total Bilirubin: 0.5 mg/dL (ref 0.2–1.2)
Total Protein: 7.2 g/dL (ref 6.0–8.3)

## 2020-01-19 LAB — LIPID PANEL
Cholesterol: 175 mg/dL (ref 0–200)
HDL: 74.4 mg/dL (ref >39.00)
LDL Cholesterol: 86 mg/dL (ref 0–99)
NonHDL: 100.66
Total CHOL/HDL Ratio: 2
Triglycerides: 71 mg/dL (ref 0.0–149.0)
VLDL: 14.2 mg/dL (ref 0.0–40.0)

## 2020-01-19 LAB — HEMOGLOBIN A1C: Hgb A1c MFr Bld: 7.5 % — ABNORMAL HIGH (ref 4.6–6.5)

## 2020-01-19 NOTE — Progress Notes (Signed)
No critical labs need to be addressed urgently. We will discuss labs in detail at upcoming office visit.   

## 2020-01-23 ENCOUNTER — Encounter: Payer: Self-pay | Admitting: Family Medicine

## 2020-01-23 ENCOUNTER — Ambulatory Visit (INDEPENDENT_AMBULATORY_CARE_PROVIDER_SITE_OTHER): Payer: 59 | Admitting: Family Medicine

## 2020-01-23 ENCOUNTER — Other Ambulatory Visit: Payer: Self-pay

## 2020-01-23 VITALS — BP 126/74 | HR 79 | Temp 98.0°F | Ht 59.5 in | Wt 124.2 lb

## 2020-01-23 DIAGNOSIS — H9193 Unspecified hearing loss, bilateral: Secondary | ICD-10-CM | POA: Insufficient documentation

## 2020-01-23 DIAGNOSIS — E1165 Type 2 diabetes mellitus with hyperglycemia: Secondary | ICD-10-CM

## 2020-01-23 DIAGNOSIS — Z0000 Encounter for general adult medical examination without abnormal findings: Secondary | ICD-10-CM | POA: Diagnosis not present

## 2020-01-23 MED ORDER — ATORVASTATIN CALCIUM 10 MG PO TABS: 10.0000 mg | ORAL_TABLET | Freq: Every day | ORAL | 11 refills | Status: DC

## 2020-01-23 NOTE — Progress Notes (Signed)
Chief Complaint  Patient presents with  . Annual Exam    History of Present Illness: HPI The patient is here for annual wellness exam and preventative care.   She has been under  A lot of stress.Marland Kitchen got Divorce in 06/2019, father with cancer, passed away 2022/10/15.. use hydroxyzine for anxiety.. helps with panic. Having some increase in indigestion. Once and while < once every 2-3 weeks. Using antacid or pepcid.  IBS flaring up off and on. Fairly stable.  Prediabetes:... new diagnosis DM.   Mother with Dm. Lab Results  Component Value Date   HGBA1C 7.5 (H) 01/19/2020    BP Readings from Last 3 Encounters:  01/23/20 126/74  09/05/19 118/70  10/15/17 109/77    Lab Results  Component Value Date   CHOL 175 01/19/2020   HDL 74.40 01/19/2020   LDLCALC 86 01/19/2020   TRIG 71.0 01/19/2020   CHOLHDL 2 01/19/2020  The 10-year ASCVD risk score Denman George DC Jr., et al., 2013) is: 2.4%   Values used to calculate the score:     Age: 59 years     Sex: Female     Is Non-Hispanic African American: No     Diabetic: No     Tobacco smoker: No     Systolic Blood Pressure: 126 mmHg     Is BP treated: No     HDL Cholesterol: 74.4 mg/dL     Total Cholesterol: 175 mg/dL   Diet:  Exercise:    This visit occurred during the SARS-CoV-2 public health emergency.  Safety protocols were in place, including screening questions prior to the visit, additional usage of staff PPE, and extensive cleaning of exam room while observing appropriate contact time as indicated for disinfecting solutions.   COVID 19 screen:  No recent travel or known exposure to COVID19 The patient denies respiratory symptoms of COVID 19 at this time. The importance of social distancing was discussed today.     Review of Systems  Constitutional: Negative for chills and fever.  HENT: Negative for congestion and ear pain.   Eyes: Negative for pain and redness.  Respiratory: Negative for cough and shortness of breath.    Cardiovascular: Negative for chest pain, palpitations and leg swelling.       Rare Nonexertional stress/anxiety induced chest pain.  Gastrointestinal: Negative for abdominal pain, blood in stool, constipation, diarrhea, nausea and vomiting.  Genitourinary: Negative for dysuria.  Musculoskeletal: Negative for falls and myalgias.  Skin: Negative for rash.  Neurological: Negative for dizziness.  Psychiatric/Behavioral: Negative for depression. The patient is not nervous/anxious.       Past Medical History:  Diagnosis Date  . Allergy   . Anxiety   . GERD (gastroesophageal reflux disease)   . Hemorrhoid   . Hiatal hernia   . IBS (irritable bowel syndrome)   . Spherocytosis, hereditary (HCC)     reports that she has never smoked. She has never used smokeless tobacco. She reports that she does not drink alcohol and does not use drugs.   Current Outpatient Medications:  .  Ascorbic Acid (VITAMIN C) 1000 MG tablet, Take 1,000 mg by mouth daily. (Patient not taking: Reported on 01/23/2020), Disp: , Rfl:  .  BIOTIN PO, Take 1 tablet by mouth. two to three x week (Patient not taking: Reported on 01/23/2020), Disp: , Rfl:  .  clobetasol ointment (TEMOVATE) 0.05 %, Apply 1 application topically 2 (two) times daily. (Patient not taking: Reported on 09/05/2019), Disp: 30 g, Rfl: 0 .  ELDERBERRY PO, Take 1 tablet by mouth daily. (Patient not taking: Reported on 01/23/2020), Disp: , Rfl:  .  famotidine (PEPCID) 10 MG tablet, Take 10 mg by mouth 2 (two) times daily. (Patient not taking: Reported on 01/23/2020), Disp: , Rfl:  .  hydrOXYzine (ATARAX/VISTARIL) 10 MG tablet, Take 1 tablet (10 mg total) by mouth 3 (three) times daily as needed for anxiety. (Patient not taking: Reported on 01/23/2020), Disp: 30 tablet, Rfl: 0 .  zinc gluconate 50 MG tablet, Take 50 mg by mouth daily. (Patient not taking: Reported on 01/23/2020), Disp: , Rfl:    Observations/Objective: Blood pressure 126/74, pulse 79, temperature  98 F (36.7 C), temperature source Temporal, height 4' 11.5" (1.511 m), weight 124 lb 4 oz (56.4 kg), SpO2 97 %.  Physical Exam Constitutional:      General: She is not in acute distress.    Appearance: Normal appearance. She is well-developed. She is not ill-appearing or toxic-appearing.  HENT:     Head: Normocephalic.     Right Ear: Hearing, tympanic membrane, ear canal and external ear normal.     Left Ear: Hearing, tympanic membrane, ear canal and external ear normal.     Nose: Nose normal.  Eyes:     General: Lids are normal. Lids are everted, no foreign bodies appreciated.     Conjunctiva/sclera: Conjunctivae normal.     Pupils: Pupils are equal, round, and reactive to light.  Neck:     Thyroid: No thyroid mass or thyromegaly.     Vascular: No carotid bruit.     Trachea: Trachea normal.  Cardiovascular:     Rate and Rhythm: Normal rate and regular rhythm.     Heart sounds: Normal heart sounds, S1 normal and S2 normal. No murmur heard.  No gallop.   Pulmonary:     Effort: Pulmonary effort is normal. No respiratory distress.     Breath sounds: Normal breath sounds. No wheezing, rhonchi or rales.  Abdominal:     General: Bowel sounds are normal. There is no distension or abdominal bruit.     Palpations: Abdomen is soft. There is no fluid wave or mass.     Tenderness: There is no abdominal tenderness. There is no guarding or rebound.     Hernia: No hernia is present.  Musculoskeletal:     Cervical back: Normal range of motion and neck supple.  Lymphadenopathy:     Cervical: No cervical adenopathy.  Skin:    General: Skin is warm and dry.     Findings: No rash.  Neurological:     Mental Status: She is alert.     Cranial Nerves: No cranial nerve deficit.     Sensory: No sensory deficit.  Psychiatric:        Mood and Affect: Mood is not anxious or depressed.        Speech: Speech normal.        Behavior: Behavior normal. Behavior is cooperative.        Judgment: Judgment  normal.      Diabetic foot exam: Normal inspection No skin breakdown No calluses  Normal DP pulses Normal sensation to light touch and monofilament Nails normal   Assessment and Plan The patient's preventative maintenance and recommended screening tests for an annual wellness exam were reviewed in full today. Brought up to date unless services declined.  Counselled on the importance of diet, exercise, and its role in overall health and mortality. The patient's FH and SH was reviewed, including  their home life, tobacco status, and drug and alcohol status.   Vaccines: Uptodate with  Tdap.  Will get flu when available.  Discussed COVID19 vaccine side effects and benefits. Strongly encouraged the patient to get the vaccine. Questions answered. PAP/DVE: 2010, 2011.. [id number] no HPV DVE yearly, Due q5 years Mammo: 02/2019 nml Colon: nml 05/2010, Dr. Juanda Chance, repeat in 10 years.  Nonsmoker Hep C negative. HIV: refused.      Uncontrolled diabetes mellitus (HCC) Start statin for CAD prevention. Reviewed new DM diagnosis.Marland Kitchen refused dietician referral.  reviewed standards of care of DM. She will work on low carb diet and re-eval in 3 months.     Kerby Nora, MD

## 2020-01-23 NOTE — Patient Instructions (Addendum)
Set up yearly eye exam for diabetes and have the opthalmologist send [place] a copy of the evaluation for the chart. Start low dose atorvastatin... if side effects call for adjustment recommendations.  Get flu shot, pneumovax and consider COVID vaccine

## 2020-01-23 NOTE — Assessment & Plan Note (Signed)
Start statin for CAD prevention. Reviewed new DM diagnosis.Marland Kitchen refused dietician referral.  reviewed standards of care of DM. She will work on low carb diet and re-eval in 3 months.

## 2020-04-11 ENCOUNTER — Telehealth: Payer: Self-pay | Admitting: Family Medicine

## 2020-04-11 DIAGNOSIS — E1165 Type 2 diabetes mellitus with hyperglycemia: Secondary | ICD-10-CM

## 2020-04-11 NOTE — Telephone Encounter (Signed)
-----   Message from Alvina Chou sent at 03/29/2020  3:33 PM EDT ----- Regarding: lab orders for Friday, 11.19.21 Lab orders for f/u appt

## 2020-04-12 ENCOUNTER — Other Ambulatory Visit: Payer: Self-pay

## 2020-04-12 ENCOUNTER — Other Ambulatory Visit (INDEPENDENT_AMBULATORY_CARE_PROVIDER_SITE_OTHER): Payer: 59

## 2020-04-12 DIAGNOSIS — E1165 Type 2 diabetes mellitus with hyperglycemia: Secondary | ICD-10-CM | POA: Diagnosis not present

## 2020-04-12 LAB — COMPREHENSIVE METABOLIC PANEL
ALT: 13 U/L (ref 0–35)
AST: 15 U/L (ref 0–37)
Albumin: 4.2 g/dL (ref 3.5–5.2)
Alkaline Phosphatase: 51 U/L (ref 39–117)
BUN: 14 mg/dL (ref 6–23)
CO2: 30 mEq/L (ref 19–32)
Calcium: 9.1 mg/dL (ref 8.4–10.5)
Chloride: 104 mEq/L (ref 96–112)
Creatinine, Ser: 0.66 mg/dL (ref 0.40–1.20)
GFR: 95.35 mL/min (ref >60.00)
Glucose, Bld: 126 mg/dL — ABNORMAL HIGH (ref 70–99)
Potassium: 4.4 mEq/L (ref 3.5–5.1)
Sodium: 139 mEq/L (ref 135–145)
Total Bilirubin: 0.6 mg/dL (ref 0.2–1.2)
Total Protein: 6.7 g/dL (ref 6.0–8.3)

## 2020-04-12 LAB — LIPID PANEL
Cholesterol: 166 mg/dL (ref 0–200)
HDL: 66.7 mg/dL (ref >39.00)
LDL Cholesterol: 85 mg/dL (ref 0–99)
NonHDL: 99.66
Total CHOL/HDL Ratio: 2
Triglycerides: 71 mg/dL (ref 0.0–149.0)
VLDL: 14.2 mg/dL (ref 0.0–40.0)

## 2020-04-12 LAB — HEMOGLOBIN A1C: Hgb A1c MFr Bld: 6.8 % — ABNORMAL HIGH (ref 4.6–6.5)

## 2020-04-12 LAB — MICROALBUMIN / CREATININE URINE RATIO
Creatinine,U: 96.6 mg/dL
Microalb Creat Ratio: 1.1 mg/g (ref 0.0–30.0)
Microalb, Ur: 1 mg/dL (ref 0.0–1.9)

## 2020-04-12 NOTE — Progress Notes (Signed)
No critical labs need to be addressed urgently. We will discuss labs in detail at upcoming office visit.   

## 2020-04-26 ENCOUNTER — Encounter: Payer: Self-pay | Admitting: Family Medicine

## 2020-04-26 ENCOUNTER — Ambulatory Visit: Payer: 59 | Admitting: Family Medicine

## 2020-04-26 ENCOUNTER — Other Ambulatory Visit: Payer: Self-pay

## 2020-04-26 VITALS — BP 110/72 | HR 80 | Temp 98.1°F | Ht 59.5 in | Wt 124.5 lb

## 2020-04-26 DIAGNOSIS — E785 Hyperlipidemia, unspecified: Secondary | ICD-10-CM | POA: Insufficient documentation

## 2020-04-26 DIAGNOSIS — E119 Type 2 diabetes mellitus without complications: Secondary | ICD-10-CM

## 2020-04-26 DIAGNOSIS — E1169 Type 2 diabetes mellitus with other specified complication: Secondary | ICD-10-CM | POA: Diagnosis not present

## 2020-04-26 DIAGNOSIS — Z23 Encounter for immunization: Secondary | ICD-10-CM | POA: Diagnosis not present

## 2020-04-26 NOTE — Assessment & Plan Note (Addendum)
At goal , but has not started statin... encouraged her to do so to decreased CV risk.  LDL < 100.

## 2020-04-26 NOTE — Patient Instructions (Addendum)
Set up yearly eye exam for diabetes and have the opthalmologist send [place] a copy of the evaluation for the chart. Consider starting the atorvastatin 3 days a week or more. Please call the location of your choice from the menu below to schedule your Mammogram and/or Bone Density appointment.    [place]   1. [facility]                      Phone:  [phone] [address]                               [place], [place] [address]                                                             Services: Traditional and 3D Mammogram, Bone Density   2. [facility]                 Phone: [phone] [address]                                                       [place], [place] [address]    Service: Bone Density ONLY   *this site does NOT perform mammograms  3. [facility]                        Phone:  [phone] [address]                                  [place], [place] [address]                                            Services:  3D Mammogram and Bone Density    [place]  1. [facility]   Phone:  [phone]   [address]                                                                            [place], [place] [address]                                            Services: 3D Mammogram and Bone Density  2. [facility])  Phone:  [phone]   [address]  [place], [place] [address]                                              Services:  3D Mammogram and Bone Density

## 2020-04-26 NOTE — Progress Notes (Signed)
Chief Complaint  Patient presents with  . Diabetes    History of Present Illness: HPI    59 year old female presents for DM follow up.  Diabetes:  Improved control with lifestyle changes.  Diet controlled. Lab Results  Component Value Date   HGBA1C 6.8 (H) 04/12/2020  Using medications without difficulties: Hypoglycemic episodes: Hyperglycemic episodes: Feet problems: Blood Sugars averaging: eye exam within last year: DUE   Elevated Cholesterol:  Good control  LDL < 100 .. has not started... atorvastatin 10 mg daily. Lab Results  Component Value Date   CHOL 166 04/12/2020   HDL 66.70 04/12/2020   LDLCALC 85 04/12/2020   TRIG 71.0 04/12/2020   CHOLHDL 2 04/12/2020  Using medications without problems: Muscle aches:  Diet compliance: She has been cutting back on carbohydrates... no more cereal and ice cream. Exercise: Staying active overall. Other complaints:    Wt Readings from Last 3 Encounters:  04/26/20 124 lb 8 oz (56.5 kg)  01/23/20 124 lb 4 oz (56.4 kg)  09/05/19 119 lb (54 kg)        This visit occurred during the SARS-CoV-2 public health emergency.  Safety protocols were in place, including screening questions prior to the visit, additional usage of staff PPE, and extensive cleaning of exam room while observing appropriate contact time as indicated for disinfecting solutions.   COVID 19 screen:  No recent travel or known exposure to COVID19 The patient denies respiratory symptoms of COVID 19 at this time. The importance of social distancing was discussed today.     ROS    Past Medical History:  Diagnosis Date  . Allergy   . Anxiety   . GERD (gastroesophageal reflux disease)   . Hemorrhoid   . Hiatal hernia   . IBS (irritable bowel syndrome)   . Spherocytosis, hereditary (HCC)     reports that she has never smoked. She has never used smokeless tobacco. She reports that she does not drink alcohol and does not use drugs.   Current Outpatient  Medications:  .  BIOTIN PO, Take 1 tablet by mouth. two to three x week , Disp: , Rfl:  .  CINNAMON PO, Take 1,000 mg by mouth daily., Disp: , Rfl:  .  clobetasol ointment (TEMOVATE) 0.05 %, Apply 1 application topically 2 (two) times daily., Disp: 30 g, Rfl: 0 .  famotidine (PEPCID) 10 MG tablet, Take 10 mg by mouth 2 (two) times daily. , Disp: , Rfl:  .  hydrOXYzine (ATARAX/VISTARIL) 10 MG tablet, Take 1 tablet (10 mg total) by mouth 3 (three) times daily as needed for anxiety., Disp: 30 tablet, Rfl: 0 .  atorvastatin (LIPITOR) 10 MG tablet, Take 1 tablet (10 mg total) by mouth daily. (Patient not taking: Reported on 04/26/2020), Disp: 30 tablet, Rfl: 11   Observations/Objective: Blood pressure 110/72, pulse 80, temperature 98.1 F (36.7 C), temperature source Temporal, height 4' 11.5" (1.511 m), weight 124 lb 8 oz (56.5 kg), SpO2 97 %.  Physical Exam Constitutional:      General: She is not in acute distress.    Appearance: Normal appearance. She is well-developed. She is not ill-appearing or toxic-appearing.  HENT:     Head: Normocephalic.     Right Ear: Hearing, tympanic membrane, ear canal and external ear normal. Tympanic membrane is not erythematous, retracted or bulging.     Left Ear: Hearing, tympanic membrane, ear canal and external ear normal. Tympanic membrane is not erythematous, retracted or bulging.  Nose: No mucosal edema or rhinorrhea.     Right Sinus: No maxillary sinus tenderness or frontal sinus tenderness.     Left Sinus: No maxillary sinus tenderness or frontal sinus tenderness.     Mouth/Throat:     Pharynx: Uvula midline.  Eyes:     General: Lids are normal. Lids are everted, no foreign bodies appreciated.     Conjunctiva/sclera: Conjunctivae normal.     Pupils: Pupils are equal, round, and reactive to light.  Neck:     Thyroid: No thyroid mass or thyromegaly.     Vascular: No carotid bruit.     Trachea: Trachea normal.  Cardiovascular:     Rate and  Rhythm: Normal rate and regular rhythm.     Pulses: Normal pulses.     Heart sounds: Normal heart sounds, S1 normal and S2 normal. No murmur heard.  No friction rub. No gallop.   Pulmonary:     Effort: Pulmonary effort is normal. No tachypnea or respiratory distress.     Breath sounds: Normal breath sounds. No decreased breath sounds, wheezing, rhonchi or rales.  Abdominal:     General: Bowel sounds are normal.     Palpations: Abdomen is soft.     Tenderness: There is no abdominal tenderness.  Musculoskeletal:     Cervical back: Normal range of motion and neck supple.  Skin:    General: Skin is warm and dry.     Findings: No rash.  Neurological:     Mental Status: She is alert.  Psychiatric:        Mood and Affect: Mood is not anxious or depressed.        Speech: Speech normal.        Behavior: Behavior normal. Behavior is cooperative.        Thought Content: Thought content normal.        Judgment: Judgment normal.      Assessment and Plan   Hyperlipidemia associated with type 2 diabetes mellitus (HCC) At goal , but has not started statin... encouraged her to do so to decreased CV risk.  LDL < 100.  Diabetes mellitus without complication (HCC) Improved control  With diet changes.  Neg microalbuminuria. Reminded about eye exam yearly.  No complications.     Kerby Nora, MD

## 2020-04-26 NOTE — Assessment & Plan Note (Signed)
Improved control  With diet changes.  Neg microalbuminuria. Reminded about eye exam yearly.  No complications.

## 2020-06-20 ENCOUNTER — Other Ambulatory Visit: Payer: Self-pay | Admitting: Family Medicine

## 2020-06-20 DIAGNOSIS — Z1231 Encounter for screening mammogram for malignant neoplasm of breast: Secondary | ICD-10-CM

## 2020-08-09 ENCOUNTER — Inpatient Hospital Stay: Admission: RE | Admit: 2020-08-09 | Payer: 59 | Source: Ambulatory Visit

## 2020-09-12 ENCOUNTER — Encounter: Payer: Self-pay | Admitting: Gastroenterology

## 2020-09-27 LAB — HM DIABETES EYE EXAM

## 2020-10-02 ENCOUNTER — Ambulatory Visit
Admission: RE | Admit: 2020-10-02 | Discharge: 2020-10-02 | Disposition: A | Discharge status: Home / Self Care | Payer: 59 | Source: Ambulatory Visit | Attending: Family Medicine | Admitting: Family Medicine

## 2020-10-02 ENCOUNTER — Other Ambulatory Visit: Payer: Self-pay

## 2020-10-02 DIAGNOSIS — Z1231 Encounter for screening mammogram for malignant neoplasm of breast: Secondary | ICD-10-CM

## 2020-10-25 ENCOUNTER — Ambulatory Visit: Payer: 59 | Admitting: Family Medicine

## 2020-10-25 ENCOUNTER — Encounter: Payer: Self-pay | Admitting: Family Medicine

## 2020-10-25 ENCOUNTER — Other Ambulatory Visit: Payer: Self-pay

## 2020-10-25 VITALS — BP 112/64 | HR 83 | Temp 98.2°F | Ht 59.5 in | Wt 120.0 lb

## 2020-10-25 DIAGNOSIS — E119 Type 2 diabetes mellitus without complications: Secondary | ICD-10-CM

## 2020-10-25 DIAGNOSIS — E1169 Type 2 diabetes mellitus with other specified complication: Secondary | ICD-10-CM | POA: Diagnosis not present

## 2020-10-25 DIAGNOSIS — E785 Hyperlipidemia, unspecified: Secondary | ICD-10-CM

## 2020-10-25 LAB — POCT GLYCOSYLATED HEMOGLOBIN (HGB A1C): Hemoglobin A1C: 7.2 % — AB (ref 4.0–5.6)

## 2020-10-25 NOTE — Patient Instructions (Signed)
Get back on track with low carb diet, try to work on adding exercise to regimen.

## 2020-10-25 NOTE — Assessment & Plan Note (Addendum)
Worsened control.Marland Kitchen offered medication to treat.. pt not interested at this time. Will get back on track with diet and exercise. Reviewed DM care. Follow up in 3 months.

## 2020-10-25 NOTE — Assessment & Plan Note (Signed)
ON statin.  

## 2020-10-25 NOTE — Progress Notes (Signed)
Patient ID: Natalie Hudson, female    DOB: Oct 22, 1960, 60 y.o.   MRN: [id number]  This visit was conducted in person.  BP 112/64   Pulse 83   Temp 98.2 F (36.8 C) (Temporal)   Ht 4' 11.5" (1.511 m)   Wt 120 lb (54.4 kg)   SpO2 97%   BMI 23.83 kg/m    CC:  Chief Complaint  Patient presents with  . Diabetes    6 month follow up      Subjective:   HPI: Natalie Hudson is a 60 y.o. female presenting on 10/25/2020 for Diabetes (6 month follow up /)    Diabetes:  SLightly worsened control in last 6 months.. A1C up from 6.8. Diet controlled.  She reports she has not been focusing as much on eating habits.. she has had to increase caregiving for mother in last 2 months.. increased stress.   Lab Results  Component Value Date   HGBA1C 7.2 (A) 10/25/2020  Using medications without difficulties: Hypoglycemic episodes:none Hyperglycemic episodes:none Feet problems: no ulcers Blood Sugars averaging: FBS 150  eye exam within last year: 09/27/2020  At last check 6 months ago.Marland Kitchen LDL was at goal < 100 on atorvastatin 10 mg daily.     BP Readings from Last 3 Encounters:  10/25/20 112/64  04/26/20 110/72  01/23/20 126/74     Relevant past medical, surgical, family and social history reviewed and updated as indicated. Interim medical history since our last visit reviewed. Allergies and medications reviewed and updated. Outpatient Medications Prior to Visit  Medication Sig Dispense Refill  . atorvastatin (LIPITOR) 10 MG tablet Take 1 tablet (10 mg total) by mouth daily. 30 tablet 11  . BIOTIN PO Take 1 tablet by mouth. two to three x week     . Cholecalciferol (VITAMIN D3) 25 MCG (1000 UT) CAPS Take 1 capsule by mouth daily.    Marland Kitchen CINNAMON PO Take 1,000 mg by mouth daily.    . clobetasol ointment (TEMOVATE) 0.05 % Apply 1 application topically 2 (two) times daily. 30 g 0  . famotidine (PEPCID) 10 MG tablet Take 10 mg by mouth 2 (two) times daily.     . hydrOXYzine  (ATARAX/VISTARIL) 10 MG tablet Take 1 tablet (10 mg total) by mouth 3 (three) times daily as needed for anxiety. 30 tablet 0   No facility-administered medications prior to visit.     Per HPI unless specifically indicated in ROS section below Review of Systems  Constitutional: Negative for fatigue and fever.  HENT: Negative for congestion.   Eyes: Negative for pain.  Respiratory: Negative for cough and shortness of breath.   Cardiovascular: Negative for chest pain, palpitations and leg swelling.  Gastrointestinal: Negative for abdominal pain.  Genitourinary: Negative for dysuria and vaginal bleeding.  Musculoskeletal: Negative for back pain.  Neurological: Negative for syncope, light-headedness and headaches.  Psychiatric/Behavioral: Negative for dysphoric mood.   Objective:  BP 112/64   Pulse 83   Temp 98.2 F (36.8 C) (Temporal)   Ht 4' 11.5" (1.511 m)   Wt 120 lb (54.4 kg)   SpO2 97%   BMI 23.83 kg/m   Wt Readings from Last 3 Encounters:  10/25/20 120 lb (54.4 kg)  04/26/20 124 lb 8 oz (56.5 kg)  01/23/20 124 lb 4 oz (56.4 kg)      Physical Exam Constitutional:      General: She is not in acute distress.    Appearance: Normal appearance. She is well-developed.  She is not ill-appearing or toxic-appearing.  HENT:     Head: Normocephalic.     Right Ear: Hearing, tympanic membrane, ear canal and external ear normal. Tympanic membrane is not erythematous, retracted or bulging.     Left Ear: Hearing, tympanic membrane, ear canal and external ear normal. Tympanic membrane is not erythematous, retracted or bulging.     Nose: No mucosal edema or rhinorrhea.     Right Sinus: No maxillary sinus tenderness or frontal sinus tenderness.     Left Sinus: No maxillary sinus tenderness or frontal sinus tenderness.     Mouth/Throat:     Pharynx: Uvula midline.  Eyes:     General: Lids are normal. Lids are everted, no foreign bodies appreciated.     Conjunctiva/sclera: Conjunctivae  normal.     Pupils: Pupils are equal, round, and reactive to light.  Neck:     Thyroid: No thyroid mass or thyromegaly.     Vascular: No carotid bruit.     Trachea: Trachea normal.  Cardiovascular:     Rate and Rhythm: Normal rate and regular rhythm.     Pulses: Normal pulses.     Heart sounds: Normal heart sounds, S1 normal and S2 normal. No murmur heard. No friction rub. No gallop.   Pulmonary:     Effort: Pulmonary effort is normal. No tachypnea or respiratory distress.     Breath sounds: Normal breath sounds. No decreased breath sounds, wheezing, rhonchi or rales.  Abdominal:     General: Bowel sounds are normal.     Palpations: Abdomen is soft.     Tenderness: There is no abdominal tenderness.  Musculoskeletal:     Cervical back: Normal range of motion and neck supple.  Skin:    General: Skin is warm and dry.     Findings: No rash.  Neurological:     Mental Status: She is alert.  Psychiatric:        Mood and Affect: Mood is not anxious or depressed.        Speech: Speech normal.        Behavior: Behavior normal. Behavior is cooperative.        Thought Content: Thought content normal.        Judgment: Judgment normal.       Results for orders placed or performed in visit on 10/25/20  POCT glycosylated hemoglobin (Hb A1C)  Result Value Ref Range   Hemoglobin A1C 7.2 (A) 4.0 - 5.6 %   HbA1c POC (<> result, manual entry)     HbA1c, POC (prediabetic range)     HbA1c, POC (controlled diabetic range)      This visit occurred during the SARS-CoV-2 public health emergency.  Safety protocols were in place, including screening questions prior to the visit, additional usage of staff PPE, and extensive cleaning of exam room while observing appropriate contact time as indicated for disinfecting solutions.   COVID 19 screen:  No recent travel or known exposure to COVID19 The patient denies respiratory symptoms of COVID 19 at this time. The importance of social distancing was  discussed today.   Assessment and Plan Problem List Items Addressed This Visit    Diabetes mellitus without complication (HCC) - Primary    Worsened control.Marland Kitchen offered medication to treat.. pt not interested at this time. Will get back on track with diet and exercise. Reviewed DM care. Follow up in 3 months.      Relevant Orders   POCT glycosylated hemoglobin (Hb A1C) (Completed)  Hyperlipidemia associated with type 2 diabetes mellitus (HCC)    ON statin.        Orders Placed This Encounter  Procedures  . POCT glycosylated hemoglobin (Hb A1C)       Kerby Nora, MD

## 2021-01-29 ENCOUNTER — Telehealth: Payer: Self-pay | Admitting: Family Medicine

## 2021-01-29 DIAGNOSIS — E119 Type 2 diabetes mellitus without complications: Secondary | ICD-10-CM

## 2021-01-29 NOTE — Telephone Encounter (Signed)
-----   Message from Alvina Chou sent at 01/20/2021  9:17 AM EDT ----- Regarding: Lab orders for Monday, 9.12.22 Patient is scheduled for CPX labs, please order future labs, Thanks , Camelia Eng

## 2021-02-03 ENCOUNTER — Other Ambulatory Visit: Payer: 59

## 2021-02-07 ENCOUNTER — Ambulatory Visit: Payer: 59 | Admitting: Family Medicine

## 2021-02-28 ENCOUNTER — Encounter: Payer: 59 | Admitting: Family Medicine

## 2021-06-02 LAB — HM DIABETES FOOT EXAM

## 2021-06-06 ENCOUNTER — Other Ambulatory Visit: Payer: Self-pay

## 2021-06-06 ENCOUNTER — Ambulatory Visit (INDEPENDENT_AMBULATORY_CARE_PROVIDER_SITE_OTHER): Payer: Managed Care, Other (non HMO) | Admitting: Family Medicine

## 2021-06-06 ENCOUNTER — Other Ambulatory Visit (HOSPITAL_COMMUNITY)
Admission: RE | Admit: 2021-06-06 | Discharge: 2021-06-06 | Disposition: A | Discharge status: Home / Self Care | Payer: Commercial Managed Care - HMO | Source: Ambulatory Visit | Attending: Family Medicine | Admitting: Family Medicine

## 2021-06-06 ENCOUNTER — Encounter: Payer: Self-pay | Admitting: Family Medicine

## 2021-06-06 VITALS — BP 90/60 | HR 97 | Temp 98.3°F | Ht 59.5 in | Wt 122.1 lb

## 2021-06-06 DIAGNOSIS — Z0000 Encounter for general adult medical examination without abnormal findings: Secondary | ICD-10-CM

## 2021-06-06 DIAGNOSIS — Z124 Encounter for screening for malignant neoplasm of cervix: Secondary | ICD-10-CM

## 2021-06-06 DIAGNOSIS — E1169 Type 2 diabetes mellitus with other specified complication: Secondary | ICD-10-CM

## 2021-06-06 DIAGNOSIS — Z23 Encounter for immunization: Secondary | ICD-10-CM | POA: Diagnosis not present

## 2021-06-06 DIAGNOSIS — E119 Type 2 diabetes mellitus without complications: Secondary | ICD-10-CM | POA: Diagnosis not present

## 2021-06-06 DIAGNOSIS — E785 Hyperlipidemia, unspecified: Secondary | ICD-10-CM | POA: Diagnosis not present

## 2021-06-06 LAB — LIPID PANEL
Cholesterol: 209 mg/dL — ABNORMAL HIGH (ref 0–200)
HDL: 83 mg/dL (ref >39.00)
LDL Cholesterol: 108 mg/dL — ABNORMAL HIGH (ref 0–99)
NonHDL: 125.99
Total CHOL/HDL Ratio: 3
Triglycerides: 92 mg/dL (ref 0.0–149.0)
VLDL: 18.4 mg/dL (ref 0.0–40.0)

## 2021-06-06 LAB — COMPREHENSIVE METABOLIC PANEL
ALT: 15 U/L (ref 0–35)
AST: 14 U/L (ref 0–37)
Albumin: 4.3 g/dL (ref 3.5–5.2)
Alkaline Phosphatase: 59 U/L (ref 39–117)
BUN: 12 mg/dL (ref 6–23)
CO2: 30 mEq/L (ref 19–32)
Calcium: 9.1 mg/dL (ref 8.4–10.5)
Chloride: 101 mEq/L (ref 96–112)
Creatinine, Ser: 0.7 mg/dL (ref 0.40–1.20)
GFR: 93.25 mL/min (ref >60.00)
Glucose, Bld: 169 mg/dL — ABNORMAL HIGH (ref 70–99)
Potassium: 4 mEq/L (ref 3.5–5.1)
Sodium: 138 mEq/L (ref 135–145)
Total Bilirubin: 0.6 mg/dL (ref 0.2–1.2)
Total Protein: 7 g/dL (ref 6.0–8.3)

## 2021-06-06 LAB — MICROALBUMIN / CREATININE URINE RATIO
Creatinine,U: 100.6 mg/dL
Microalb Creat Ratio: 0.8 mg/g (ref 0.0–30.0)
Microalb, Ur: 0.8 mg/dL (ref 0.0–1.9)

## 2021-06-06 LAB — HEMOGLOBIN A1C: Hgb A1c MFr Bld: 8.1 % — ABNORMAL HIGH (ref 4.6–6.5)

## 2021-06-06 MED ORDER — HYDROXYZINE HCL 10 MG PO TABS: 10.0000 mg | ORAL_TABLET | Freq: Three times a day (TID) | ORAL | 0 refills | Status: DC | PRN

## 2021-06-06 NOTE — Assessment & Plan Note (Signed)
Due for re-eval.   Likely inadequate control given CBS not at goal.  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  If med needed we can consider glipizide.

## 2021-06-06 NOTE — Addendum Note (Signed)
Addended by: Damita Lack on: 06/06/2021 10:01 AM   Modules accepted: Orders

## 2021-06-06 NOTE — Progress Notes (Signed)
Patient ID: Natalie Hudson, female    DOB: 1960/11/17, 61 y.o.   MRN: [id number]  This visit was conducted in person.   CC: Chief Complaint  Patient presents with   Annual Exam    Subjective:   HPI: Natalie Hudson is a 61 y.o. female presenting on 06/06/2021 for Annual Exam  She reports her mother passed away after Christmas. Had a stroke.  She  is requesting a refill of hydroxyzine to use for anxiety spells. She is grieving  normally overall.  Diabetes: Due for re-eval. She is not on medication.. does not want to use metformin as has IBS and this may make it worse. Using medications without difficulties: Hypoglycemic episodes:none Hyperglycemic episodes:none Feet problems: no ulcers Blood Sugars averaging: CBGs  153-178 eye exam within last year: 09/2020  Elevated Cholesterol: Due for re-eval. She is no longer taking atorvastatin as she feel it caused dizziness. Lab Results  Component Value Date   CHOL 166 04/12/2020   HDL 66.70 04/12/2020   LDLCALC 85 04/12/2020   TRIG 71.0 04/12/2020   CHOLHDL 2 04/12/2020  Using medications without problems: Muscle aches:  Diet compliance: moderate.. poor during the holiday's Exercise: none Other complaints:      Relevant past medical, surgical, family and social history reviewed and updated as indicated. Interim medical history since our last visit reviewed. Allergies and medications reviewed and updated. Outpatient Medications Prior to Visit  Medication Sig Dispense Refill   atorvastatin (LIPITOR) 10 MG tablet Take 1 tablet (10 mg total) by mouth daily. 30 tablet 11   BIOTIN PO Take 1 tablet by mouth. two to three x week      Cholecalciferol (VITAMIN D3) 25 MCG (1000 UT) CAPS Take 1 capsule by mouth daily.     CINNAMON PO Take 1,000 mg by mouth daily.     clobetasol ointment (TEMOVATE) 0.05 % Apply 1 application topically 2 (two) times daily. 30 g 0   famotidine (PEPCID) 10 MG tablet Take 10 mg by mouth 2 (two) times  daily.      hydrOXYzine (ATARAX/VISTARIL) 10 MG tablet Take 1 tablet (10 mg total) by mouth 3 (three) times daily as needed for anxiety. 30 tablet 0   No facility-administered medications prior to visit.     Per HPI unless specifically indicated in ROS section below Review of Systems Objective:  There were no vitals taken for this visit.  Wt Readings from Last 3 Encounters:  10/25/20 120 lb (54.4 kg)  04/26/20 124 lb 8 oz (56.5 kg)  01/23/20 124 lb 4 oz (56.4 kg)      Physical Exam Constitutional:      General: She is not in acute distress.    Appearance: Normal appearance. She is well-developed. She is not ill-appearing or toxic-appearing.  HENT:     Head: Normocephalic.     Right Ear: Hearing, tympanic membrane, ear canal and external ear normal.     Left Ear: Hearing, tympanic membrane, ear canal and external ear normal.     Nose: Nose normal.  Eyes:     General: Lids are normal. Lids are everted, no foreign bodies appreciated.     Conjunctiva/sclera: Conjunctivae normal.     Pupils: Pupils are equal, round, and reactive to light.  Neck:     Thyroid: No thyroid mass or thyromegaly.     Vascular: No carotid bruit.     Trachea: Trachea normal.  Cardiovascular:     Rate and Rhythm: Normal rate and  regular rhythm.     Heart sounds: Normal heart sounds, S1 normal and S2 normal. No murmur heard.   No gallop.  Pulmonary:     Effort: Pulmonary effort is normal. No respiratory distress.     Breath sounds: Normal breath sounds. No wheezing, rhonchi or rales.  Abdominal:     General: Bowel sounds are normal. There is no distension or abdominal bruit.     Palpations: Abdomen is soft. There is no fluid wave or mass.     Tenderness: There is no abdominal tenderness. There is no guarding or rebound.     Hernia: No hernia is present.  Genitourinary:    Exam position: Supine.     Labia:        Right: No rash, tenderness or lesion.        Left: No rash, tenderness or lesion.       Vagina: Normal.     Cervix: No cervical motion tenderness, discharge or friability.     Uterus: Not enlarged and not tender.      Adnexa:        Right: No mass, tenderness or fullness.         Left: No mass, tenderness or fullness.    Musculoskeletal:     Cervical back: Normal range of motion and neck supple.  Lymphadenopathy:     Cervical: No cervical adenopathy.  Skin:    General: Skin is warm and dry.     Findings: No rash.  Neurological:     Mental Status: She is alert.     Cranial Nerves: No cranial nerve deficit.     Sensory: No sensory deficit.  Psychiatric:        Mood and Affect: Mood is not anxious or depressed.        Speech: Speech normal.        Behavior: Behavior normal. Behavior is cooperative.        Judgment: Judgment normal.    Diabetic foot exam: Normal inspection No skin breakdown No calluses  Normal DP pulses Normal sensation to light touch and monofilament Nails normal     Results for orders placed or performed in visit on 10/25/20  POCT glycosylated hemoglobin (Hb A1C)  Result Value Ref Range   Hemoglobin A1C 7.2 (A) 4.0 - 5.6 %   HbA1c POC (<> result, manual entry)     HbA1c, POC (prediabetic range)     HbA1c, POC (controlled diabetic range)      This visit occurred during the SARS-CoV-2 public health emergency.  Safety protocols were in place, including screening questions prior to the visit, additional usage of staff PPE, and extensive cleaning of exam room while observing appropriate contact time as indicated for disinfecting solutions.   COVID 19 screen:  No recent travel or known exposure to COVID19 The patient denies respiratory symptoms of COVID 19 at this time. The importance of social distancing was discussed today.   Assessment and Plan The patient's preventative maintenance and recommended screening tests for an annual wellness exam were reviewed in full today. Brought up to date unless services declined.  Counselled on the  importance of diet, exercise, and its role in overall health and mortality. The patient's FH and SH was reviewed, including their home life, tobacco status, and drug and alcohol status.   Vaccines:  Will give flu and PNA 23 vaccine today. Uptodate with  Tdap.   Consider shingrix vaccine and  Discussed COVID19 vaccine side effects and benefits. Strongly encouraged  the patient to get the vaccine. Questions answered. PAP/DVE: 2010, 2011.. [id number] no HPV DVE yearly, Due q5 years Due Mammo: 09/2020 nml Colon: nml 05/2010, Dr. Juanda ChanceBrodie, repeat in 10 years.  Repeat due now  Nonsmoker Hep C negative. HIV: refused.    Problem List Items Addressed This Visit     Diabetes mellitus without complication (HCC)    Due for re-eval.   Likely inadequate control given CBS not at goal.  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  If med needed we can consider glipizide.      Relevant Orders   Hemoglobin A1c   Microalbumin / creatinine urine ratio   Hyperlipidemia associated with type 2 diabetes mellitus (HCC)    Due for re-eval.  Was intolerant of low dose atorvastatin.. may need to try different statin or atorvastatin three days a week.      Relevant Orders   Lipid panel   Comprehensive metabolic panel   Other Visit Diagnoses     Routine general medical examination at a health care facility    -  Primary        Kerby NoraAmy Nicolo Tomko, MD

## 2021-06-06 NOTE — Assessment & Plan Note (Signed)
Due for re-eval.  Was intolerant of low dose atorvastatin.. may need to try different statin or atorvastatin three days a week.

## 2021-06-06 NOTE — Patient Instructions (Addendum)
Please stop at the lab to have labs drawn.  Call the GI office to schedule colonoscopy.  Get mammogram in 09/2021.

## 2021-06-09 LAB — CYTOLOGY - PAP
Comment: NEGATIVE
Diagnosis: NEGATIVE
High risk HPV: NEGATIVE

## 2021-06-10 ENCOUNTER — Other Ambulatory Visit: Payer: Self-pay | Admitting: Family Medicine

## 2021-06-10 MED ORDER — GLIPIZIDE ER 5 MG PO TB24: 5.0000 mg | ORAL_TABLET | Freq: Every day | ORAL | 11 refills | Status: DC

## 2021-06-10 NOTE — Progress Notes (Signed)
Sent in Rx for glipizide  Xl.. take once daily. Needs to take near a meal and not skip meals to avoid low blood sugars.  Does she have a way to check blood sugars at home.. if not we can send in glucometer of choice, lancets and test strips.  Please let her know goal fasting is  < 120,  2 hour post prandial < 180. Too low is < 60  Keep record and bring to next OV.  Make appt in 3 months  DM follow up if not already made.

## 2021-06-10 NOTE — Progress Notes (Signed)
Natalie Hudson notified as instructed by telephone.  Patient states understanding. She does have diabetic testing supplies and will start checking FBS and a 2 HR PP and keep log to bring to next office visit. .  She already has a 3 month DM follow up scheduled on 09/04/2021

## 2021-06-17 ENCOUNTER — Encounter: Payer: Self-pay | Admitting: Family Medicine

## 2021-06-17 NOTE — Progress Notes (Unsigned)
I have printed this letter , signed and it is in you in box to have the patient pick up.

## 2021-07-16 ENCOUNTER — Telehealth: Payer: Managed Care, Other (non HMO) | Admitting: Physician Assistant

## 2021-07-16 DIAGNOSIS — J019 Acute sinusitis, unspecified: Secondary | ICD-10-CM

## 2021-07-16 DIAGNOSIS — B9689 Other specified bacterial agents as the cause of diseases classified elsewhere: Secondary | ICD-10-CM

## 2021-07-16 MED ORDER — ALBUTEROL SULFATE HFA 108 (90 BASE) MCG/ACT IN AERS: 2.0000 | INHALATION_SPRAY | Freq: Four times a day (QID) | RESPIRATORY_TRACT | 0 refills | Status: DC | PRN

## 2021-07-16 MED ORDER — DOXYCYCLINE HYCLATE 100 MG PO TABS: 100.0000 mg | ORAL_TABLET | Freq: Two times a day (BID) | ORAL | 0 refills | Status: DC

## 2021-07-16 MED ORDER — BENZONATATE 100 MG PO CAPS: 100.0000 mg | ORAL_CAPSULE | Freq: Three times a day (TID) | ORAL | 0 refills | Status: DC | PRN

## 2021-07-16 NOTE — Patient Instructions (Signed)
Natalie Hudson, thank you for joining [name], PA-C for today's virtual visit.  While this provider is not your primary care provider (PCP), if your PCP is located in our provider database this encounter information will be shared with them immediately following your visit.  Consent: (Patient) Natalie Hudson provided verbal consent for this virtual visit at the beginning of the encounter.  Current Medications:  Current Outpatient Medications:    BIOTIN PO, Take 1 tablet by mouth. two to three x week , Disp: , Rfl:    Cholecalciferol (VITAMIN D3) 25 MCG (1000 UT) CAPS, Take 1 capsule by mouth daily., Disp: , Rfl:    CINNAMON PO, Take 3,000 mg by mouth daily., Disp: , Rfl:    glipiZIDE (GLUCOTROL XL) 5 MG 24 hr tablet, Take 1 tablet (5 mg total) by mouth daily with breakfast., Disp: 30 tablet, Rfl: 11   hydrOXYzine (ATARAX) 10 MG tablet, Take 1 tablet (10 mg total) by mouth 3 (three) times daily as needed for anxiety., Disp: 30 tablet, Rfl: 0   Zinc 50 MG TABS, Take 1 tablet by mouth daily., Disp: , Rfl:    Medications ordered in this encounter:  No orders of the defined types were placed in this encounter.    *If you need refills on other medications prior to your next appointment, please contact your pharmacy*  Follow-Up: Call back or seek an in-person evaluation if the symptoms worsen or if the condition fails to improve as anticipated.  Other Instructions Please take antibiotic as directed.  Increase fluid intake.  Use Saline nasal spray.  Take a daily multivitamin. You can use the Diabetic Tussin over-the-counter and take the Tessalon I have sent in along with it. You can also start Vitamin D3 1000 unit daily, Vitamin C 1000 mg daily.  Place a humidifier in the bedroom.  Please call or return clinic if symptoms are not improving.  Sinusitis Sinusitis is redness, soreness, and swelling (inflammation) of the paranasal sinuses. Paranasal sinuses are air pockets within the  bones of your face (beneath the eyes, the middle of the forehead, or above the eyes). In healthy paranasal sinuses, mucus is able to drain out, and air is able to circulate through them by way of your nose. However, when your paranasal sinuses are inflamed, mucus and air can become trapped. This can allow bacteria and other germs to grow and cause infection. Sinusitis can develop quickly and last only a short time (acute) or continue over a long period (chronic). Sinusitis that lasts for more than 12 weeks is considered chronic.  CAUSES  Causes of sinusitis include: Allergies. Structural abnormalities, such as displacement of the cartilage that separates your nostrils (deviated septum), which can decrease the air flow through your nose and sinuses and affect sinus drainage. Functional abnormalities, such as when the small hairs (cilia) that line your sinuses and help remove mucus do not work properly or are not present. SYMPTOMS  Symptoms of acute and chronic sinusitis are the same. The primary symptoms are pain and pressure around the affected sinuses. Other symptoms include: Upper toothache. Earache. Headache. Bad breath. Decreased sense of smell and taste. A cough, which worsens when you are lying flat. Fatigue. Fever. Thick drainage from your nose, which often is green and may contain pus (purulent). Swelling and warmth over the affected sinuses. DIAGNOSIS  Your caregiver will perform a physical exam. During the exam, your caregiver may: Look in your nose for signs of abnormal growths in your  nostrils (nasal polyps). Tap over the affected sinus to check for signs of infection. View the inside of your sinuses (endoscopy) with a special imaging device with a light attached (endoscope), which is inserted into your sinuses. If your caregiver suspects that you have chronic sinusitis, one or more of the following tests may be recommended: Allergy tests. Nasal culture A sample of mucus is  taken from your nose and sent to a lab and screened for bacteria. Nasal cytology A sample of mucus is taken from your nose and examined by your caregiver to determine if your sinusitis is related to an allergy. TREATMENT  Most cases of acute sinusitis are related to a viral infection and will resolve on their own within 10 days. Sometimes medicines are prescribed to help relieve symptoms (pain medicine, decongestants, nasal steroid sprays, or saline sprays).  However, for sinusitis related to a bacterial infection, your caregiver will prescribe antibiotic medicines. These are medicines that will help kill the bacteria causing the infection.  Rarely, sinusitis is caused by a fungal infection. In theses cases, your caregiver will prescribe antifungal medicine. For some cases of chronic sinusitis, surgery is needed. Generally, these are cases in which sinusitis recurs more than 3 times per year, despite other treatments. HOME CARE INSTRUCTIONS  Drink plenty of water. Water helps thin the mucus so your sinuses can drain more easily. Use a humidifier. Inhale steam 3 to 4 times a day (for example, sit in the bathroom with the shower running). Apply a warm, moist washcloth to your face 3 to 4 times a day, or as directed by your caregiver. Use saline nasal sprays to help moisten and clean your sinuses. Take over-the-counter or prescription medicines for pain, discomfort, or fever only as directed by your caregiver. SEEK IMMEDIATE MEDICAL CARE IF: You have increasing pain or severe headaches. You have nausea, vomiting, or drowsiness. You have swelling around your face. You have vision problems. You have a stiff neck. You have difficulty breathing. MAKE SURE YOU:  Understand these instructions. Will watch your condition. Will get help right away if you are not doing well or get worse. Document Released: 05/11/2005 Document Revised: 08/03/2011 Document Reviewed: 05/26/2011 [facility] Patient  Information 2014 [place], [place].    If you have been instructed to have an in-person evaluation today at a local Urgent Care facility, please use the link below. It will take you to a list of all of our available [facility], including address, phone number and hours of operation. Please do not delay care.  [facility]  If you or a family member do not have a primary care provider, use the link below to schedule a visit and establish care. When you choose a [facility] primary care physician or advanced practice provider, you gain a long-term partner in health. Find a Primary Care Provider  Learn more about [facility] in-office and virtual care options: [facility] Get Care Now

## 2021-07-16 NOTE — Progress Notes (Signed)
Virtual Visit Consent   Natalie Hudson, you are scheduled for a virtual visit with a [facility] provider today.     Just as with appointments in the office, your consent must be obtained to participate.  Your consent will be active for this visit and any virtual visit you may have with one of our providers in the next 365 days.     If you have a MyChart account, a copy of this consent can be sent to you electronically.  All virtual visits are billed to your insurance company just like a traditional visit in the office.    As this is a virtual visit, video technology does not allow for your provider to perform a traditional examination.  This may limit your provider's ability to fully assess your condition.  If your provider identifies any concerns that need to be evaluated in person or the need to arrange testing (such as labs, EKG, etc.), we will make arrangements to do so.     Although advances in technology are sophisticated, we cannot ensure that it will always work on either your end or our end.  If the connection with a video visit is poor, the visit may have to be switched to a telephone visit.  With either a video or telephone visit, we are not always able to ensure that we have a secure connection.     I need to obtain your verbal consent now.   Are you willing to proceed with your visit today?    [name] has provided verbal consent on 07/16/2021 for a virtual visit (video or telephone).   Natalie Hudson, [place]   Date: 07/16/2021 1:05 PM   Virtual Visit via Video Note   I, Natalie Hudson, connected with  Natalie Hudson  ([id number], 02-Jul-1960) on 07/16/21 at  1:00 PM EST by a video-enabled telemedicine application and verified that I am speaking with the correct person using two identifiers.  Location: Patient: Virtual Visit Location Patient: Home Provider: Virtual Visit Location Provider: Home Office   I discussed the limitations of evaluation and management  by telemedicine and the availability of in person appointments. The patient expressed understanding and agreed to proceed.    History of Present Illness: Natalie Hudson is a 61 y.o. who identifies as a female who was assigned female at birth, and is being seen today for several weeks of URI symptoms that she initially was just a cold but after a week continued to worsen and over the past 3-4 days noting some chest tightness along with this. Has been having chest congestion, sinus congestion with increased cough. Now with maxillary sinus tenderness. Denies fever, chills or aches.  HPI: HPI  Problems:  Patient Active Problem List   Diagnosis Date Noted   Hyperlipidemia associated with type 2 diabetes mellitus (HCC) 04/26/2020   Decreased hearing, bilateral 01/23/2020   Left-sided chest wall pain 09/20/2019   Epigastric pain 09/20/2019   COVID-19 virus infection 05/23/2019   Diabetes mellitus without complication (HCC) 05/08/2013   BPPV (benign paroxysmal positional vertigo) 09/09/2012   IBS (irritable bowel syndrome) 06/26/2011   ANXIETY 07/27/2007   ALLERGIC RHINITIS 07/27/2007    Allergies:  Allergies  Allergen Reactions   Escitalopram Oxalate     REACTION: nausea 10 mg   Sertraline Hcl     REACTION: sluggish   Medications:  Current Outpatient Medications:    albuterol (VENTOLIN HFA) 108 (90 Base) MCG/ACT inhaler, Inhale 2 puffs into the  lungs every 6 (six) hours as needed for wheezing or shortness of breath., Disp: 8 g, Rfl: 0   benzonatate (TESSALON) 100 MG capsule, Take 1 capsule (100 mg total) by mouth 3 (three) times daily as needed for cough., Disp: 30 capsule, Rfl: 0   doxycycline (VIBRA-TABS) 100 MG tablet, Take 1 tablet (100 mg total) by mouth 2 (two) times daily., Disp: 20 tablet, Rfl: 0   BIOTIN PO, Take 1 tablet by mouth. two to three x week , Disp: , Rfl:    Cholecalciferol (VITAMIN D3) 25 MCG (1000 UT) CAPS, Take 1 capsule by mouth daily., Disp: , Rfl:    CINNAMON  PO, Take 3,000 mg by mouth daily., Disp: , Rfl:    glipiZIDE (GLUCOTROL XL) 5 MG 24 hr tablet, Take 1 tablet (5 mg total) by mouth daily with breakfast., Disp: 30 tablet, Rfl: 11   hydrOXYzine (ATARAX) 10 MG tablet, Take 1 tablet (10 mg total) by mouth 3 (three) times daily as needed for anxiety., Disp: 30 tablet, Rfl: 0   Zinc 50 MG TABS, Take 1 tablet by mouth daily., Disp: , Rfl:   Observations/Objective: Patient is well-developed, well-nourished in no acute distress.  Resting comfortably at home.  Head is normocephalic, atraumatic.  No labored breathing. Speech is clear and coherent with logical content.  Patient is alert and oriented at baseline.   Assessment and Plan: 1. Acute bacterial sinusitis - albuterol (VENTOLIN HFA) 108 (90 Base) MCG/ACT inhaler; Inhale 2 puffs into the lungs every 6 (six) hours as needed for wheezing or shortness of breath.  Dispense: 8 g; Refill: 0 - benzonatate (TESSALON) 100 MG capsule; Take 1 capsule (100 mg total) by mouth 3 (three) times daily as needed for cough.  Dispense: 30 capsule; Refill: 0 - doxycycline (VIBRA-TABS) 100 MG tablet; Take 1 tablet (100 mg total) by mouth 2 (two) times daily.  Dispense: 20 tablet; Refill: 0  Rx Doxycycline.  Increase fluids.  Rest.  Saline nasal spray.  Probiotic.  Mucinex as directed.  Humidifier in bedroom. Tessalon per orders.  Call or return to clinic if symptoms are not improving.   Follow Up Instructions: I discussed the assessment and treatment plan with the patient. The patient was provided an opportunity to ask questions and all were answered. The patient agreed with the plan and demonstrated an understanding of the instructions.  A copy of instructions were sent to the patient via MyChart unless otherwise noted below.   The patient was advised to call back or seek an in-person evaluation if the symptoms worsen or if the condition fails to improve as anticipated.  Time:  I spent 10 minutes with the patient  via telehealth technology discussing the above problems/concerns.    Natalie Climes, PA-C

## 2021-09-04 ENCOUNTER — Ambulatory Visit (INDEPENDENT_AMBULATORY_CARE_PROVIDER_SITE_OTHER): Payer: Managed Care, Other (non HMO) | Admitting: Family Medicine

## 2021-09-04 VITALS — BP 108/76 | HR 84 | Temp 97.5°F | Resp 12 | Ht 59.5 in | Wt 127.0 lb

## 2021-09-04 DIAGNOSIS — E119 Type 2 diabetes mellitus without complications: Secondary | ICD-10-CM | POA: Diagnosis not present

## 2021-09-04 DIAGNOSIS — M25841 Other specified joint disorders, right hand: Secondary | ICD-10-CM

## 2021-09-04 LAB — POCT GLYCOSYLATED HEMOGLOBIN (HGB A1C): Hemoglobin A1C: 6.5 % — AB (ref 4.0–5.6)

## 2021-09-04 NOTE — Assessment & Plan Note (Signed)
Chronic, improved ? ?Significant improvement with initiation of low-dose Glucotrol XL 5 mg daily.  We discussed how to treat any potential lows in detail.  If she has CBGs less than 60 more consistently with increased exercise and lower carbohydrate diet, we will consider decreasing Glucotrol XL to 2.5 mg daily versus holding altogether. ?

## 2021-09-04 NOTE — Assessment & Plan Note (Signed)
Acute ? ?No associated pain or contracture.  No red flags or need for referral at this point.  If issues occur she will call for referral to hand specialist for possible steroid injection versus removal. ?

## 2021-09-04 NOTE — Patient Instructions (Addendum)
Continue current regimen. ? Call if blood sugars start dropping with increased diet and exercise control.. we can consider 2.5 mg XL dose. ?

## 2021-09-04 NOTE — Progress Notes (Signed)
? ? Patient ID: Natalie Hudson, female    DOB: 1961/05/01, 61 y.o.   MRN: [id number] ? ?This visit was conducted in person. ? ?BP 108/76   Pulse 84   Temp (!) 97.5 ?F (36.4 ?C)   Resp 12   Ht 4' 11.5" (1.511 m)   Wt 127 lb (57.6 kg)   SpO2 97%   BMI 25.22 kg/m?   ? ?CC:  ?Chief Complaint  ?Patient presents with  ? Diabetes  ?  Follow up ?  ? ? ?Subjective:  ? ?HPI: ?Natalie Hudson is a 61 y.o. female presenting on 09/04/2021 for Diabetes (Follow up/) ? ?Diabetes:  Much improved with lifestyle change and glipizide  5 mgXL.Marland Kitchen compared to last A1C 8.1. ?Lab Results  ?Component Value Date  ? HGBA1C 6.5 (A) 09/04/2021  ?Using medications without difficulties: none ?Hypoglycemic episodes: occ in 70s ?Hyperglycemic episodes: only once when missed medication. ?Feet problems: none ?Blood Sugars averaging: FBS 120-140 ?eye exam within last year: ? ? She is walking 2-4 days a week. ? Diet: moderate low carb. ?   ? ?Relevant past medical, surgical, family and social history reviewed and updated as indicated. Interim medical history since our last visit reviewed. ?Allergies and medications reviewed and updated. ?Outpatient Medications Prior to Visit  ?Medication Sig Dispense Refill  ? BIOTIN PO Take 1 tablet by mouth. two to three x week     ? Cholecalciferol (VITAMIN D3) 25 MCG (1000 UT) CAPS Take 1 capsule by mouth daily.    ? glipiZIDE (GLUCOTROL XL) 5 MG 24 hr tablet Take 1 tablet (5 mg total) by mouth daily with breakfast. 30 tablet 11  ? hydrOXYzine (ATARAX) 10 MG tablet Take 1 tablet (10 mg total) by mouth 3 (three) times daily as needed for anxiety. 30 tablet 0  ? Zinc 50 MG TABS Take 1 tablet by mouth daily.    ? albuterol (VENTOLIN HFA) 108 (90 Base) MCG/ACT inhaler Inhale 2 puffs into the lungs every 6 (six) hours as needed for wheezing or shortness of breath. 8 g 0  ? benzonatate (TESSALON) 100 MG capsule Take 1 capsule (100 mg total) by mouth 3 (three) times daily as needed for cough. 30 capsule 0  ? CINNAMON  PO Take 3,000 mg by mouth daily.    ? doxycycline (VIBRA-TABS) 100 MG tablet Take 1 tablet (100 mg total) by mouth 2 (two) times daily. 20 tablet 0  ? ?No facility-administered medications prior to visit.  ?  ? ?Per HPI unless specifically indicated in ROS section below ?Review of Systems  ?Constitutional:  Negative for fatigue and fever.  ?HENT:  Negative for congestion.   ?Eyes:  Negative for pain.  ?Respiratory:  Negative for cough and shortness of breath.   ?Cardiovascular:  Negative for chest pain, palpitations and leg swelling.  ?Gastrointestinal:  Negative for abdominal pain.  ?Genitourinary:  Negative for dysuria and vaginal bleeding.  ?Musculoskeletal:  Negative for back pain.  ?Neurological:  Negative for syncope, light-headedness and headaches.  ?Psychiatric/Behavioral:  Negative for dysphoric mood.   ?Objective:  ?BP 108/76   Pulse 84   Temp (!) 97.5 ?F (36.4 ?C)   Resp 12   Ht 4' 11.5" (1.511 m)   Wt 127 lb (57.6 kg)   SpO2 97%   BMI 25.22 kg/m?   ?Wt Readings from Last 3 Encounters:  ?09/04/21 127 lb (57.6 kg)  ?06/06/21 122 lb 2 oz (55.4 kg)  ?10/25/20 120 lb (54.4 kg)  ?  ?  ?  Physical Exam ?Constitutional:   ?   General: She is not in acute distress. ?   Appearance: Normal appearance. She is well-developed. She is not ill-appearing or toxic-appearing.  ?HENT:  ?   Head: Normocephalic.  ?   Right Ear: Hearing, tympanic membrane, ear canal and external ear normal. Tympanic membrane is not erythematous, retracted or bulging.  ?   Left Ear: Hearing, tympanic membrane, ear canal and external ear normal. Tympanic membrane is not erythematous, retracted or bulging.  ?   Nose: No mucosal edema or rhinorrhea.  ?   Right Sinus: No maxillary sinus tenderness or frontal sinus tenderness.  ?   Left Sinus: No maxillary sinus tenderness or frontal sinus tenderness.  ?   Mouth/Throat:  ?   Pharynx: Uvula midline.  ?Eyes:  ?   General: Lids are normal. Lids are everted, no foreign bodies appreciated.  ?    Conjunctiva/sclera: Conjunctivae normal.  ?   Pupils: Pupils are equal, round, and reactive to light.  ?Neck:  ?   Thyroid: No thyroid mass or thyromegaly.  ?   Vascular: No carotid bruit.  ?   Trachea: Trachea normal.  ?Cardiovascular:  ?   Rate and Rhythm: Normal rate and regular rhythm.  ?   Pulses: Normal pulses.  ?   Heart sounds: Normal heart sounds, S1 normal and S2 normal. No murmur heard. ?  No friction rub. No gallop.  ?Pulmonary:  ?   Effort: Pulmonary effort is normal. No tachypnea or respiratory distress.  ?   Breath sounds: Normal breath sounds. No decreased breath sounds, wheezing, rhonchi or rales.  ?Abdominal:  ?   General: Bowel sounds are normal.  ?   Palpations: Abdomen is soft.  ?   Tenderness: There is no abdominal tenderness.  ?Musculoskeletal:  ?   Cervical back: Normal range of motion and neck supple.  ?Skin: ?   General: Skin is warm and dry.  ?   Findings: No rash.  ?   Comments: 2 small nodules on right palm, no erythema no pain no clear association with tendon.  No contracture.  ?Neurological:  ?   Mental Status: She is alert.  ?Psychiatric:     ?   Mood and Affect: Mood is not anxious or depressed.     ?   Speech: Speech normal.     ?   Behavior: Behavior normal. Behavior is cooperative.     ?   Thought Content: Thought content normal.     ?   Judgment: Judgment normal.  ? ?   ?Results for orders placed or performed in visit on 09/04/21  ?POCT glycosylated hemoglobin (Hb A1C)  ?Result Value Ref Range  ? Hemoglobin A1C 6.5 (A) 4.0 - 5.6 %  ? HbA1c POC (<> result, manual entry)    ? HbA1c, POC (prediabetic range)    ? HbA1c, POC (controlled diabetic range)    ? ? ?This visit occurred during the SARS-CoV-2 public health emergency.  Safety protocols were in place, including screening questions prior to the visit, additional usage of staff PPE, and extensive cleaning of exam room while observing appropriate contact time as indicated for disinfecting solutions.  ? ?COVID 19 screen:  No recent  travel or known exposure to COVID19 ?The patient denies respiratory symptoms of COVID 19 at this time. ?The importance of social distancing was discussed today.  ? ?Assessment and Plan ?Problem List Items Addressed This Visit   ? ? Cyst of joint of hand, right  ?  Acute ? ?No associated pain or contracture.  No red flags or need for referral at this point.  If issues occur she will call for referral to hand specialist for possible steroid injection versus removal. ?  ?  ? Diabetes mellitus without complication (HCC) - Primary  ?  Chronic, improved ? ?Significant improvement with initiation of low-dose Glucotrol XL 5 mg daily.  We discussed how to treat any potential lows in detail.  If she has CBGs less than 60 more consistently with increased exercise and lower carbohydrate diet, we will consider decreasing Glucotrol XL to 2.5 mg daily versus holding altogether. ?  ?  ? Relevant Orders  ? POCT glycosylated hemoglobin (Hb A1C) (Completed)  ? ? ?  ? ?Kerby Nora, MD  ? ?

## 2021-10-21 ENCOUNTER — Other Ambulatory Visit: Payer: Self-pay | Admitting: Family Medicine

## 2021-10-21 ENCOUNTER — Encounter: Payer: Self-pay | Admitting: Family Medicine

## 2021-10-21 MED ORDER — GLIPIZIDE ER 2.5 MG PO TB24: 2.5000 mg | ORAL_TABLET | Freq: Every day | ORAL | 11 refills | Status: DC

## 2021-11-03 ENCOUNTER — Telehealth: Payer: Self-pay | Admitting: Family Medicine

## 2021-11-03 ENCOUNTER — Ambulatory Visit (INDEPENDENT_AMBULATORY_CARE_PROVIDER_SITE_OTHER): Payer: Commercial Managed Care - HMO | Admitting: Family Medicine

## 2021-11-03 VITALS — BP 100/80 | HR 79 | Temp 98.2°F | Ht 59.5 in | Wt 132.5 lb

## 2021-11-03 DIAGNOSIS — R42 Dizziness and giddiness: Secondary | ICD-10-CM | POA: Diagnosis not present

## 2021-11-03 DIAGNOSIS — E1169 Type 2 diabetes mellitus with other specified complication: Secondary | ICD-10-CM

## 2021-11-03 DIAGNOSIS — H8110 Benign paroxysmal vertigo, unspecified ear: Secondary | ICD-10-CM | POA: Diagnosis not present

## 2021-11-03 DIAGNOSIS — R202 Paresthesia of skin: Secondary | ICD-10-CM | POA: Insufficient documentation

## 2021-11-03 MED ORDER — MECLIZINE HCL 12.5 MG PO TABS: 12.5000 mg | ORAL_TABLET | Freq: Three times a day (TID) | ORAL | 0 refills | Status: AC | PRN

## 2021-11-03 NOTE — Telephone Encounter (Signed)
Pt called to schedule an appointment for dizziness, I transferred her to Access Nurse as well to speak with someone. She stated she had no other symptoms besides the dizziness. Please advise thanks.   Callback Number: [phone]

## 2021-11-03 NOTE — Telephone Encounter (Signed)
PLEASE NOTE: All timestamps contained within this report are represented as [place] Standard Time. CONFIDENTIALTY NOTICE: This fax transmission is intended only for the addressee. It contains information that is legally privileged, confidential or otherwise protected from use or disclosure. If you are not the intended recipient, you are strictly prohibited from reviewing, disclosing, copying using or disseminating any of this information or taking any action in reliance on or regarding this information. If you have received this fax in error, please notify us immediately by telephone so that we can arrange for its return to [place]. Phone: [phone], Toll-Free: [phone], Fax: [phone] Page: 1 of 2 Call Id: [id number] [facility] Day - Client TELEPHONE ADVICE RECORD AccessNurse Patient Name: Natalie Hudson AND REWS Gender: Female DOB: 1961/01/01 Age: 61 Y 11 M 23 D Return Phone Number: [phone] (Primary) Address: [place]/ [place]/ [place]  [address] Client [facility] Day - Client Client Site [facility] - Day Provider Kerby Nora - MD Contact Type Call Who Is Calling Patient / Member / Family / Caregiver Call Type Triage / Clinical Relationship To Patient Self Return Phone Number [phone] (Primary) Chief Complaint Dizziness Reason for Call Symptomatic / Request for Health Information Initial Comment Caller is experiencing dizzy spells and headaches at random times. Translation No Nurse Assessment Nurse: Alexander Mt, RN, Nicholaus Bloom Date/Time (Eastern Time): 11/03/2021 10:13:12 AM Confirm and document reason for call. If symptomatic, describe symptoms. ---Caller is experiencing dizzy spells that started on thursday afternoon and headaches at random times. States on friday she had tingling down both arms that only lasted about 15 sec. This morning she had tingling in her face that lasted about 10 minutes.  She is diabetic. Does the patient have any new or worsening symptoms? ---Yes Will a triage be completed? ---Yes Related visit to physician within the last 2 weeks? ---No Does the PT have any chronic conditions? (i.e. diabetes, asthma, this includes High risk factors for pregnancy, etc.) ---Yes List chronic conditions. ---diabetic Is this a behavioral health or substance abuse call? ---No Guidelines Guideline Title Affirmed Question Affirmed Notes Nurse Date/Time (Eastern Time) Dizziness - Lightheadedness [1] MODERATE dizziness (e.g., interferes with normal activities) AND [2] has NOT been evaluated by doctor (or NP/PA) for this (Exception: Alexander Mt, RN, Nicholaus Bloom 11/03/2021 10:16:34 AM PLEASE NOTE: All timestamps contained within this report are represented as [place] Standard Time. CONFIDENTIALTY NOTICE: This fax transmission is intended only for the addressee. It contains information that is legally privileged, confidential or otherwise protected from use or disclosure. If you are not the intended recipient, you are strictly prohibited from reviewing, disclosing, copying using or disseminating any of this information or taking any action in reliance on or regarding this information. If you have received this fax in error, please notify us immediately by telephone so that we can arrange for its return to [place]. Phone: [phone], Toll-Free: [phone], Fax: [phone] Page: 2 of 2 Call Id: [id number] Guidelines Guideline Title Affirmed Question Affirmed Notes Nurse Date/Time Lamount Cohen Time) Dizziness caused by heat exposure, sudden standing, or poor fluid intake.) Disp. Time Lamount Cohen Time) Disposition Final User 11/03/2021 10:20:20 AM See PCP within 24 Hours Yes Alexander Mt, RN, Prentiss Bells Disagree/Comply Comply Caller Understands Yes PreDisposition Call Doctor Care Advice Given Per Guideline SEE PCP WITHIN 24 HOURS: * IF OFFICE WILL BE OPEN: You need to be examined within the  next 24 hours. Call your doctor (or NP/PA) when the office opens and make an appointment. DRINK FLUIDS: *  Drink several glasses of fruit juice, other clear fluids or water. * This will improve hydration and blood glucose. LIE DOWN AND REST: * Lie down with feet elevated for 1 hour. * This will improve circulation and increase blood flow to the brain. CALL BACK IF: * Passes out (faints) * You become worse CARE ADVICE given per Dizziness (Adult) guideline. Referrals REFERRED TO PCP OFFICE

## 2021-11-03 NOTE — Assessment & Plan Note (Signed)
Intermittent. Wonder about hypoglycemia. Will also check TSH and Vit B12.

## 2021-11-03 NOTE — Patient Instructions (Addendum)
Brandt-Daroff Exercise Here's what you need to do for this exercise:  Start in an upright, seated position on your bed. Tilt your head around a 45-degree angle away from the side causing your vertigo. Move into the lying position on one side with your nose pointed up. Stay in this position for about 30 seconds or until the vertigo eases off, whichever is longer. Then move back to the seated position. Repeat on the other side. You should do these movements from three to five times in a session. You should have three sessions a day for up to 2 weeks, or until the vertigo is gone for 2 days.  Look for a video   Check your sugar during the next episode

## 2021-11-03 NOTE — Telephone Encounter (Signed)
Patient scheduled an appointment with Dr. Selena Batten today 11/03/21 at 2:00 pm.

## 2021-11-03 NOTE — Progress Notes (Signed)
Subjective:     RUBLE BUTTLER is a 61 y.o. female presenting for Dizziness (X 4 days. Happens randomly. No nausea, but has had headaches. Friday am pt had tingling down both arms for 15 seconds, while also dizzy.)     Dizziness This is a new problem. The current episode started in the past 7 days. The problem has been waxing and waning. Associated symptoms include headaches and numbness. Pertinent negatives include no change in bowel habit, chills, congestion, coughing, fever, myalgias, nausea, sore throat, vomiting or weakness. She has tried rest and drinking (motion sickness pill) for the symptoms. The treatment provided mild relief.   Can be standing or sitting First episode was standing, with improvement with sitting but still present  No room spinning Feels tightness  Does not smoke Has diabetes - is on glipizide - sugars 130 as high as 160 Does have a hx of low sugars on glipizide - was reduced to 2.5 mg as result Has not checked during an episode  Hx of vertigo - but that presented with nausea  Orthostatic VS normal  Review of Systems  Constitutional:  Negative for chills and fever.  HENT:  Negative for congestion and sore throat.   Respiratory:  Negative for cough.   Gastrointestinal:  Negative for change in bowel habit, nausea and vomiting.  Musculoskeletal:  Negative for myalgias.  Neurological:  Positive for dizziness, numbness and headaches. Negative for weakness.     Social History   Tobacco Use  Smoking Status Never  Smokeless Tobacco Never        Objective:    BP Readings from Last 3 Encounters:  11/03/21 100/80  09/04/21 108/76  06/06/21 90/60   Wt Readings from Last 3 Encounters:  11/03/21 132 lb 8 oz (60.1 kg)  09/04/21 127 lb (57.6 kg)  06/06/21 122 lb 2 oz (55.4 kg)    BP 100/80   Pulse 79   Temp 98.2 F (36.8 C) (Temporal)   Ht 4' 11.5" (1.511 m)   Wt 132 lb 8 oz (60.1 kg)   SpO2 98%   BMI 26.31 kg/m    Physical  Exam Constitutional:      General: She is not in acute distress.    Appearance: She is well-developed. She is not diaphoretic.  HENT:     Head: Normocephalic and atraumatic.     Right Ear: Tympanic membrane and external ear normal.     Left Ear: Tympanic membrane and external ear normal.     Nose: Nose normal.     Mouth/Throat:     Mouth: Mucous membranes are moist.     Pharynx: No posterior oropharyngeal erythema.  Eyes:     Extraocular Movements: Extraocular movements intact.     Right eye: Nystagmus present.     Left eye: Nystagmus present.     Conjunctiva/sclera: Conjunctivae normal.  Cardiovascular:     Rate and Rhythm: Normal rate and regular rhythm.     Heart sounds: No murmur heard. Pulmonary:     Effort: Pulmonary effort is normal. No respiratory distress.     Breath sounds: Normal breath sounds. No wheezing.  Musculoskeletal:        General: Normal range of motion.     Cervical back: Neck supple.  Skin:    General: Skin is warm and dry.     Capillary Refill: Capillary refill takes less than 2 seconds.  Neurological:     Mental Status: She is alert. Mental status is at baseline.  Motor: No weakness.     Gait: Gait normal.     Deep Tendon Reflexes: Reflexes normal.  Psychiatric:        Mood and Affect: Mood normal.        Behavior: Behavior normal.           Assessment & Plan:   Problem List Items Addressed This Visit       Endocrine   Type 2 diabetes mellitus with other specified complication (HCC)    C/b HLD.  Lab Results  Component Value Date   HGBA1C 6.5 (A) 09/04/2021  Discussed concern that tingling could be 2/2 to hypoglycemia - glipzide already reduced due to hypoglycemia. She will check cbg during next episode to assess.         Nervous and Auditory   BPPV (benign paroxysmal positional vertigo) - Primary    Nystagmus present on exam. Discussed trial of meclizine 12.5 mg as well as handout for home vertigo exercises. If not improvement -  update and will plan PT and ENT referral      Relevant Medications   meclizine (ANTIVERT) 12.5 MG tablet     Other   Dizziness    Suspect vertigo vs hypoglycemia. Orthostatic VS are normal today so fluid/hydration less likely though still a possibility. Will get labs to assess kidney and signs of anemia.       Relevant Orders   Comprehensive metabolic panel   CBC with Differential   Vitamin B12   TSH   Tingling    Intermittent. Wonder about hypoglycemia. Will also check TSH and Vit B12.       Relevant Orders   Comprehensive metabolic panel   CBC with Differential   Vitamin B12   TSH      Return if symptoms worsen or fail to improve.  Lynnda Child, MD

## 2021-11-03 NOTE — Telephone Encounter (Signed)
See note from today

## 2021-11-03 NOTE — Assessment & Plan Note (Signed)
Nystagmus present on exam. Discussed trial of meclizine 12.5 mg as well as handout for home vertigo exercises. If not improvement - update and will plan PT and ENT referral

## 2021-11-03 NOTE — Assessment & Plan Note (Signed)
Suspect vertigo vs hypoglycemia. Orthostatic VS are normal today so fluid/hydration less likely though still a possibility. Will get labs to assess kidney and signs of anemia.

## 2021-11-03 NOTE — Assessment & Plan Note (Signed)
C/b HLD.  Lab Results  Component Value Date   HGBA1C 6.5 (A) 09/04/2021   Discussed concern that tingling could be 2/2 to hypoglycemia - glipzide already reduced due to hypoglycemia. She will check cbg during next episode to assess.

## 2021-11-04 LAB — CBC WITH DIFFERENTIAL/PLATELET
Basophils Absolute: 0.1 10*3/uL (ref 0.0–0.1)
Basophils Relative: 0.8 % (ref 0.0–3.0)
Eosinophils Absolute: 0.1 10*3/uL (ref 0.0–0.7)
Eosinophils Relative: 1.1 % (ref 0.0–5.0)
HCT: 38.6 % (ref 36.0–46.0)
Hemoglobin: 12.9 g/dL (ref 12.0–15.0)
Lymphocytes Relative: 31.2 % (ref 12.0–46.0)
Lymphs Abs: 2.4 10*3/uL (ref 0.7–4.0)
MCHC: 33.3 g/dL (ref 30.0–36.0)
MCV: 91.1 fl (ref 78.0–100.0)
Monocytes Absolute: 0.6 10*3/uL (ref 0.1–1.0)
Monocytes Relative: 8.2 % (ref 3.0–12.0)
Neutro Abs: 4.5 10*3/uL (ref 1.4–7.7)
Neutrophils Relative %: 58.7 % (ref 43.0–77.0)
Platelets: 232 10*3/uL (ref 150.0–400.0)
RBC: 4.24 Mil/uL (ref 3.87–5.11)
RDW: 13.2 % (ref 11.5–15.5)
WBC: 7.6 10*3/uL (ref 4.0–10.5)

## 2021-11-04 LAB — COMPREHENSIVE METABOLIC PANEL
ALT: 16 U/L (ref 0–35)
AST: 15 U/L (ref 0–37)
Albumin: 4.4 g/dL (ref 3.5–5.2)
Alkaline Phosphatase: 59 U/L (ref 39–117)
BUN: 14 mg/dL (ref 6–23)
CO2: 31 mEq/L (ref 19–32)
Calcium: 9.6 mg/dL (ref 8.4–10.5)
Chloride: 101 mEq/L (ref 96–112)
Creatinine, Ser: 0.68 mg/dL (ref 0.40–1.20)
GFR: 93.63 mL/min (ref >60.00)
Glucose, Bld: 109 mg/dL — ABNORMAL HIGH (ref 70–99)
Potassium: 4.5 mEq/L (ref 3.5–5.1)
Sodium: 139 mEq/L (ref 135–145)
Total Bilirubin: 0.4 mg/dL (ref 0.2–1.2)
Total Protein: 6.8 g/dL (ref 6.0–8.3)

## 2021-11-04 LAB — TSH: TSH: 2.8 u[IU]/mL (ref 0.35–5.50)

## 2021-11-04 LAB — VITAMIN B12: Vitamin B-12: 276 pg/mL (ref 211–911)

## 2021-12-23 ENCOUNTER — Other Ambulatory Visit: Payer: Self-pay | Admitting: Family Medicine

## 2021-12-23 ENCOUNTER — Encounter: Payer: Self-pay | Admitting: Gastroenterology

## 2021-12-23 DIAGNOSIS — Z1231 Encounter for screening mammogram for malignant neoplasm of breast: Secondary | ICD-10-CM

## 2022-01-01 ENCOUNTER — Other Ambulatory Visit: Payer: Self-pay

## 2022-01-01 ENCOUNTER — Ambulatory Visit (AMBULATORY_SURGERY_CENTER): Payer: Self-pay | Admitting: *Deleted

## 2022-01-01 VITALS — Ht 60.0 in | Wt 135.0 lb

## 2022-01-01 DIAGNOSIS — Z1211 Encounter for screening for malignant neoplasm of colon: Secondary | ICD-10-CM

## 2022-01-01 NOTE — Progress Notes (Signed)
Pre visit in person  No egg or soy allergy known to patient  No issues known to pt with past sedation with any surgeries or procedures Patient denies ever being told they had issues or difficulty with intubation  No FH of Malignant Hyperthermia Pt is not on diet pills Pt is not on  home 02  Pt is not on blood thinners  Pt denies issues with constipation  No A fib or A flutter  Pt instructed to use Singlecare.com or GoodRx for a price reduction on prep   

## 2022-01-07 ENCOUNTER — Ambulatory Visit: Payer: Commercial Managed Care - HMO

## 2022-01-15 ENCOUNTER — Encounter: Payer: Self-pay | Admitting: Gastroenterology

## 2022-01-16 ENCOUNTER — Ambulatory Visit
Admission: RE | Admit: 2022-01-16 | Discharge: 2022-01-16 | Disposition: A | Discharge status: Home / Self Care | Payer: Commercial Managed Care - HMO | Source: Ambulatory Visit | Attending: Family Medicine | Admitting: Family Medicine

## 2022-01-16 DIAGNOSIS — Z1231 Encounter for screening mammogram for malignant neoplasm of breast: Secondary | ICD-10-CM

## 2022-01-27 ENCOUNTER — Encounter: Payer: Self-pay | Admitting: Gastroenterology

## 2022-01-27 ENCOUNTER — Ambulatory Visit (AMBULATORY_SURGERY_CENTER): Payer: Commercial Managed Care - HMO | Admitting: Gastroenterology

## 2022-01-27 VITALS — BP 118/66 | HR 69 | Temp 96.8°F | Resp 15 | Ht 59.5 in | Wt 135.0 lb

## 2022-01-27 DIAGNOSIS — Z1211 Encounter for screening for malignant neoplasm of colon: Secondary | ICD-10-CM

## 2022-01-27 MED ORDER — SODIUM CHLORIDE 0.9 % IV SOLN: 500.0000 mL | Freq: Once | INTRAVENOUS | Status: DC

## 2022-01-27 NOTE — Op Note (Addendum)
[facility] Patient Name: Natalie Hudson Procedure Date: 01/27/2022 10:56 AM MRN: [id number] Endoscopist: Tressia Danas MD, MD Age: 61 Referring MD:  Date of Birth: August 26, 1960 Gender: Female Account #: [id number] Procedure:                Colonoscopy Indications:              Screening for colorectal malignant neoplasm                           Normal colonoscopy with Dr. Juanda Chance 2012                           No known family history of colon cancer or polyps Medicines:                Monitored Anesthesia Care Procedure:                Pre-Anesthesia Assessment:                           - Prior to the procedure, a History and Physical                            was performed, and patient medications and                            allergies were reviewed. The patient's tolerance of                            previous anesthesia was also reviewed. The risks                            and benefits of the procedure and the sedation                            options and risks were discussed with the patient.                            All questions were answered, and informed consent                            was obtained. Prior Anticoagulants: The patient has                            taken no previous anticoagulant or antiplatelet                            agents. ASA Grade Assessment: II - A patient with                            mild systemic disease. After reviewing the risks                            and benefits, the patient was deemed in  satisfactory condition to undergo the procedure.                           After obtaining informed consent, the colonoscope                            was passed under direct vision. Throughout the                            procedure, the patient's blood pressure, pulse, and                            oxygen saturations were monitored continuously. The                            Olympus CF-HQ190L  (Serial# [id number]) Colonoscope was                            introduced through the anus and advanced to the 3                            cm into the ileum. A second forward view of the                            right colon was performed. The colonoscopy was                            performed without difficulty. The patient tolerated                            the procedure well. The quality of the bowel                            preparation was good. The terminal ileum, ileocecal                            valve, appendiceal orifice, and rectum were                            photographed. Scope In: 10:57:44 AM Scope Out: 11:10:15 AM Scope Withdrawal Time: 0 hours 9 minutes 26 seconds  Total Procedure Duration: 0 hours 12 minutes 31 seconds  Findings:                 The perianal and digital rectal examinations were                            normal.                           Many small and large-mouthed diverticula were found                            in the sigmoid colon.  The exam was otherwise without abnormality on                            direct and retroflexion views. Complications:            No immediate complications. Estimated Blood Loss:     Estimated blood loss was minimal. Impression:               - Diverticulosis in the sigmoid colon.                           - The examination was otherwise normal on direct                            and retroflexion views.                           - No specimens collected. Recommendation:           - Patient has a contact number available for                            emergencies. The signs and symptoms of potential                            delayed complications were discussed with the                            patient. Return to normal activities tomorrow.                            Written discharge instructions were provided to the                            patient.                           -  Resume previous diet. High fiber diet recommended.                           - Continue present medications.                           - Await pathology results.                           - Repeat colonoscopy in 10 years for surveillance,                            earlier with new symptoms.                           - Emerging evidence supports eating a diet of                            fruits, vegetables, grains, calcium, and yogurt  while reducing red meat and alcohol may reduce the                            risk of colon cancer.                           - Thank you for allowing me to be involved in your                            colon cancer prevention. Tressia Danas MD, MD 01/27/2022 11:14:59 AM This report has been signed electronically.

## 2022-01-27 NOTE — Progress Notes (Signed)
Pt's states no medical or surgical changes since previsit or office visit. 

## 2022-01-27 NOTE — Progress Notes (Signed)
To pacu, VSS. Report to [email or web address].tb 

## 2022-01-27 NOTE — Progress Notes (Signed)
   Referring Provider: Excell Seltzer, MD Primary Care Physician:  Excell Seltzer, MD  Indication for Procedure:  Colon cancer Surveillance   IMPRESSION:  Need for colon cancer surveillance Appropriate candidate for monitored anesthesia care  PLAN: Colonoscopy in the [facility] today   HPI: Natalie Hudson is a 61 y.o. female presents for surveillance colonoscopy.  Prior endoscopic history: Normal colonoscopy with Dr. Juanda Chance 06/20/10   No known family history of colon cancer or polyps. No family history of uterine/endometrial cancer, pancreatic cancer or gastric/stomach cancer.   Past Medical History:  Diagnosis Date   Allergy    Anxiety    Diabetes mellitus without complication (HCC)    GERD (gastroesophageal reflux disease)    Hemorrhoid    Hiatal hernia    IBS (irritable bowel syndrome)    Spherocytosis, hereditary (HCC)     Past Surgical History:  Procedure Laterality Date   CESAREAN SECTION  1999   TUBAL LIGATION  1999    Current Outpatient Medications  Medication Sig Dispense Refill   Ascorbic Acid (VITAMIN C WITH ROSE HIPS) 500 MG tablet Take 500 mg by mouth daily.     Calcium Carbonate-Simethicone (ALKA-SELTZER HEARTBURN + GAS) 750-80 MG CHEW Chew by mouth.     Cholecalciferol (VITAMIN D3) 25 MCG (1000 UT) CAPS Take 1 capsule by mouth daily.     cyanocobalamin 1000 MCG tablet Take 1,000 mcg by mouth daily.     glipiZIDE (GLUCOTROL XL) 2.5 MG 24 hr tablet Take 1 tablet (2.5 mg total) by mouth daily with breakfast. 30 tablet 11   hydrOXYzine (ATARAX) 10 MG tablet Take 1 tablet (10 mg total) by mouth 3 (three) times daily as needed for anxiety. 30 tablet 0   No current facility-administered medications for this visit.    Allergies as of 01/27/2022 - Review Complete 01/01/2022  Allergen Reaction Noted   Escitalopram oxalate     Sertraline hcl      Family History  Problem Relation Age of Onset   Diabetes Mother    Cancer Father    Diabetes Sister    Breast  cancer Half-Sister    Colon cancer Neg Hx    Colon polyps Neg Hx    Esophageal cancer Neg Hx    Rectal cancer Neg Hx    Stomach cancer Neg Hx      Physical Exam: General:   Alert,  well-nourished, pleasant and cooperative in NAD Head:  Normocephalic and atraumatic. Eyes:  Sclera clear, no icterus.   Conjunctiva pink. Mouth:  No deformity or lesions.   Neck:  Supple; no masses or thyromegaly. Lungs:  Clear throughout to auscultation.   No wheezes. Heart:  Regular rate and rhythm; no murmurs. Abdomen:  Soft, non-tender, nondistended, normal bowel sounds, no rebound or guarding.  Msk:  Symmetrical. No boney deformities LAD: No inguinal or umbilical LAD Extremities:  No clubbing or edema. Neurologic:  Alert and  oriented x4;  grossly nonfocal Skin:  No obvious rash or bruise. Psych:  Alert and cooperative. Normal mood and affect.     Studies/Results: No results found.    [name] L. Orvan Falconer, MD, MPH 01/27/2022, 10:21 AM

## 2022-01-27 NOTE — Patient Instructions (Signed)
Handout on diverticulosis given to you today   YOU HAD AN ENDOSCOPIC PROCEDURE TODAY AT THE [facility]:   Refer to the procedure report that was given to you for any specific questions about what was found during the examination.  If the procedure report does not answer your questions, please call your gastroenterologist to clarify.  If you requested that your care partner not be given the details of your procedure findings, then the procedure report has been included in a sealed envelope for you to review at your convenience later.  YOU SHOULD EXPECT: Some feelings of bloating in the abdomen. Passage of more gas than usual.  Walking can help get rid of the air that was put into your GI tract during the procedure and reduce the bloating. If you had a lower endoscopy (such as a colonoscopy or flexible sigmoidoscopy) you may notice spotting of blood in your stool or on the toilet paper. If you underwent a bowel prep for your procedure, you may not have a normal bowel movement for a few days.  Please Note:  You might notice some irritation and congestion in your nose or some drainage.  This is from the oxygen used during your procedure.  There is no need for concern and it should clear up in a day or so.  SYMPTOMS TO REPORT IMMEDIATELY:  Following lower endoscopy (colonoscopy or flexible sigmoidoscopy):  Excessive amounts of blood in the stool  Significant tenderness or worsening of abdominal pains  Swelling of the abdomen that is new, acute  Fever of 100F or higher  For urgent or emergent issues, a gastroenterologist can be reached at any hour by calling [phone]. Do not use MyChart messaging for urgent concerns.    DIET:  We do recommend a small meal at first, but then you may proceed to your regular diet.  Drink plenty of fluids but you should avoid alcoholic beverages for 24 hours.  ACTIVITY:  You should plan to take it easy for the rest of today and you should NOT DRIVE  or use heavy machinery until tomorrow (because of the sedation medicines used during the test).    FOLLOW UP: Our staff will call the number listed on your records the next business day following your procedure.  We will call around 7:15- 8:00 am to check on you and address any questions or concerns that you may have regarding the information given to you following your procedure. If we do not reach you, we will leave a message.  If you develop any symptoms (ie: fever, flu-like symptoms, shortness of breath, cough etc.) before then, please call [phone].  If you test positive for Covid 19 in the 2 weeks post procedure, please call and report this information to us.    SIGNATURES/CONFIDENTIALITY: You and/or your care partner have signed paperwork which will be entered into your electronic medical record.  These signatures attest to the fact that that the information above on your After Visit Summary has been reviewed and is understood.  Full responsibility of the confidentiality of this discharge information lies with you and/or your care-partner.  

## 2022-01-28 ENCOUNTER — Telehealth: Payer: Self-pay

## 2022-01-28 NOTE — Telephone Encounter (Signed)
  Follow up Call-     01/27/2022   10:40 AM  Call back number  Post procedure Call Back phone  # [phone]  Permission to leave phone message Yes     Patient questions:  Do you have a fever, pain , or abdominal swelling? No. Pain Score  0 *  Have you tolerated food without any problems? Yes.    Have you been able to return to your normal activities? Yes.    Do you have any questions about your discharge instructions: Diet   No. Medications  No. Follow up visit  No.  Do you have questions or concerns about your Care? No.  Actions: * If pain score is 4 or above: No action needed, pain <4.

## 2022-02-01 ENCOUNTER — Encounter (HOSPITAL_COMMUNITY): Payer: Self-pay

## 2022-02-01 ENCOUNTER — Other Ambulatory Visit: Payer: Self-pay

## 2022-02-01 ENCOUNTER — Telehealth: Payer: Self-pay | Admitting: Physician Assistant

## 2022-02-01 ENCOUNTER — Encounter (HOSPITAL_BASED_OUTPATIENT_CLINIC_OR_DEPARTMENT_OTHER): Payer: Self-pay

## 2022-02-01 ENCOUNTER — Emergency Department (HOSPITAL_BASED_OUTPATIENT_CLINIC_OR_DEPARTMENT_OTHER): Payer: Commercial Managed Care - HMO

## 2022-02-01 ENCOUNTER — Observation Stay (HOSPITAL_BASED_OUTPATIENT_CLINIC_OR_DEPARTMENT_OTHER)
Admission: EM | Admit: 2022-02-01 | Discharge: 2022-02-03 | Disposition: A | Discharge status: Home / Self Care | Payer: Commercial Managed Care - HMO | Attending: Emergency Medicine | Admitting: Emergency Medicine

## 2022-02-01 DIAGNOSIS — K819 Cholecystitis, unspecified: Secondary | ICD-10-CM | POA: Diagnosis present

## 2022-02-01 DIAGNOSIS — E119 Type 2 diabetes mellitus without complications: Secondary | ICD-10-CM | POA: Insufficient documentation

## 2022-02-01 DIAGNOSIS — Z8616 Personal history of COVID-19: Secondary | ICD-10-CM | POA: Diagnosis not present

## 2022-02-01 DIAGNOSIS — K810 Acute cholecystitis: Secondary | ICD-10-CM | POA: Diagnosis present

## 2022-02-01 DIAGNOSIS — R1011 Right upper quadrant pain: Secondary | ICD-10-CM | POA: Diagnosis present

## 2022-02-01 DIAGNOSIS — Z7984 Long term (current) use of oral hypoglycemic drugs: Secondary | ICD-10-CM | POA: Insufficient documentation

## 2022-02-01 DIAGNOSIS — K8000 Calculus of gallbladder with acute cholecystitis without obstruction: Secondary | ICD-10-CM | POA: Diagnosis not present

## 2022-02-01 DIAGNOSIS — E785 Hyperlipidemia, unspecified: Secondary | ICD-10-CM | POA: Insufficient documentation

## 2022-02-01 LAB — URINALYSIS, ROUTINE W REFLEX MICROSCOPIC
Bilirubin Urine: NEGATIVE
Glucose, UA: NEGATIVE mg/dL
Hgb urine dipstick: NEGATIVE
Ketones, ur: 15 mg/dL — AB
Leukocytes,Ua: NEGATIVE
Nitrite: NEGATIVE
Protein, ur: 30 mg/dL — AB
Specific Gravity, Urine: 1.028 (ref 1.005–1.030)
pH: 6 (ref 5.0–8.0)

## 2022-02-01 LAB — CBC
HCT: 35.7 % — ABNORMAL LOW (ref 36.0–46.0)
Hemoglobin: 12.1 g/dL (ref 12.0–15.0)
MCH: 30.3 pg (ref 26.0–34.0)
MCHC: 33.9 g/dL (ref 30.0–36.0)
MCV: 89.5 fL (ref 80.0–100.0)
Platelets: 200 10*3/uL (ref 150–400)
RBC: 3.99 MIL/uL (ref 3.87–5.11)
RDW: 12.2 % (ref 11.5–15.5)
WBC: 7.3 10*3/uL (ref 4.0–10.5)
nRBC: 0 % (ref 0.0–0.2)

## 2022-02-01 LAB — COMPREHENSIVE METABOLIC PANEL
ALT: 10 U/L (ref 0–44)
AST: 11 U/L — ABNORMAL LOW (ref 15–41)
Albumin: 4.2 g/dL (ref 3.5–5.0)
Alkaline Phosphatase: 52 U/L (ref 38–126)
Anion gap: 9 (ref 5–15)
BUN: 11 mg/dL (ref 8–23)
CO2: 29 mmol/L (ref 22–32)
Calcium: 9.6 mg/dL (ref 8.9–10.3)
Chloride: 101 mmol/L (ref 98–111)
Creatinine, Ser: 0.66 mg/dL (ref 0.44–1.00)
GFR, Estimated: >60 mL/min (ref >60)
Glucose, Bld: 132 mg/dL — ABNORMAL HIGH (ref 70–99)
Potassium: 3.4 mmol/L — ABNORMAL LOW (ref 3.5–5.1)
Sodium: 139 mmol/L (ref 135–145)
Total Bilirubin: 0.7 mg/dL (ref 0.3–1.2)
Total Protein: 7.2 g/dL (ref 6.5–8.1)

## 2022-02-01 LAB — CBG MONITORING, ED: Glucose-Capillary: 101 mg/dL — ABNORMAL HIGH (ref 70–99)

## 2022-02-01 LAB — LIPASE, BLOOD: Lipase: <10 U/L — ABNORMAL LOW (ref 11–51)

## 2022-02-01 MED ORDER — KETOROLAC TROMETHAMINE 15 MG/ML IJ SOLN
30.0000 mg | Freq: Four times a day (QID) | INTRAMUSCULAR | Status: DC
  Administered 2022-02-02 – 2022-02-03 (×3): 30 mg via INTRAVENOUS
  Filled 2022-02-01 (×3): qty 2

## 2022-02-01 MED ORDER — DOCUSATE SODIUM 100 MG PO CAPS
100.0000 mg | ORAL_CAPSULE | Freq: Two times a day (BID) | ORAL | Status: DC
  Administered 2022-02-02 – 2022-02-03 (×2): 100 mg via ORAL
  Filled 2022-02-01 (×2): qty 1

## 2022-02-01 MED ORDER — OXYCODONE HCL 5 MG/5ML PO SOLN
5.0000 mg | ORAL | Status: DC | PRN
  Administered 2022-02-02: 10 mg via ORAL
  Administered 2022-02-02: 5 mg via ORAL
  Filled 2022-02-01 (×2): qty 10

## 2022-02-01 MED ORDER — ENOXAPARIN SODIUM 40 MG/0.4ML IJ SOSY
40.0000 mg | PREFILLED_SYRINGE | INTRAMUSCULAR | Status: DC
  Administered 2022-02-02: 40 mg via SUBCUTANEOUS
  Filled 2022-02-01: qty 0.4

## 2022-02-01 MED ORDER — ACETAMINOPHEN 500 MG PO TABS
1000.0000 mg | ORAL_TABLET | Freq: Four times a day (QID) | ORAL | Status: DC
  Administered 2022-02-02 – 2022-02-03 (×3): 1000 mg via ORAL
  Filled 2022-02-01 (×3): qty 2

## 2022-02-01 MED ORDER — METHOCARBAMOL 500 MG PO TABS
1000.0000 mg | ORAL_TABLET | Freq: Three times a day (TID) | ORAL | Status: DC
  Administered 2022-02-02 – 2022-02-03 (×3): 1000 mg via ORAL
  Filled 2022-02-01 (×3): qty 2

## 2022-02-01 MED ORDER — SODIUM CHLORIDE 0.9 % IV SOLN
2.0000 g | INTRAVENOUS | Status: DC
  Administered 2022-02-02: 2 g via INTRAVENOUS
  Filled 2022-02-01: qty 20

## 2022-02-01 MED ORDER — IOHEXOL 300 MG/ML  SOLN
100.0000 mL | Freq: Once | INTRAMUSCULAR | Status: AC | PRN
  Administered 2022-02-01: 80 mL via INTRAVENOUS

## 2022-02-01 MED ORDER — MORPHINE SULFATE (PF) 2 MG/ML IV SOLN: 2.0000 mg | INTRAVENOUS | Status: DC | PRN

## 2022-02-01 MED ORDER — LACTATED RINGERS IV SOLN: INTRAVENOUS | Status: DC

## 2022-02-01 MED ORDER — SODIUM CHLORIDE 0.9 % IV SOLN
2.0000 g | Freq: Once | INTRAVENOUS | Status: AC
  Administered 2022-02-01: 2 g via INTRAVENOUS
  Filled 2022-02-01: qty 20

## 2022-02-01 NOTE — ED Notes (Signed)
[facility] at bedside 

## 2022-02-01 NOTE — ED Provider Notes (Signed)
[facility] EMERGENCY DEPT Provider Note   CSN: [id number] Arrival date & time: 02/01/22  1145     History  Chief Complaint  Patient presents with   Abdominal Pain   HPI Natalie Hudson is a 61 y.o. female with diabetes and IBS presenting for abdominal pain.  Pain started on Friday.  It is in her right upper quadrant extends to her flank but otherwise nonradiating.  It is constant and sharp.  Endorses a subjective fever.  Denies chest pain shortness of breath and calf tenderness.  Denies hematuria, hematochezia and melena.  Has tried Alka-Seltzer and simethicone to alleviate her symptoms but it did not help.  Mentioned that she also had a preventative colonoscopy on Tuesday which was overall unremarkable.   Abdominal Pain      Home Medications Prior to Admission medications   Medication Sig Start Date End Date Taking? Authorizing Provider  Ascorbic Acid (VITAMIN C WITH ROSE HIPS) 500 MG tablet Take 500 mg by mouth daily.    [provider]  Calcium Carbonate-Simethicone (ALKA-SELTZER HEARTBURN + GAS) 750-80 MG CHEW Chew by mouth.    [provider]  Cholecalciferol (VITAMIN D3) 25 MCG (1000 UT) CAPS Take 1 capsule by mouth daily.    [provider]  cyanocobalamin 1000 MCG tablet Take 1,000 mcg by mouth daily.    [provider]  glipiZIDE (GLUCOTROL XL) 2.5 MG 24 hr tablet Take 1 tablet (2.5 mg total) by mouth daily with breakfast. 10/21/21   Bedsole, Amy E, MD  hydrOXYzine (ATARAX) 10 MG tablet Take 1 tablet (10 mg total) by mouth 3 (three) times daily as needed for anxiety. 06/06/21   Excell Seltzer, MD      Allergies    Escitalopram oxalate and Sertraline hcl    Review of Systems   Review of Systems  Gastrointestinal:  Positive for abdominal pain.    Physical Exam Updated Vital Signs BP 113/62   Pulse 84   Temp 98.4 F (36.9 C) (Oral)   Resp 18   Ht 5' (1.524 m)   Wt 61.2 kg   SpO2 100%   BMI 26.37 kg/m   Physical Exam Vitals and nursing note reviewed.  HENT:     Head: Normocephalic and atraumatic.     Mouth/Throat:     Mouth: Mucous membranes are moist.  Eyes:     General:        Right eye: No discharge.        Left eye: No discharge.     Conjunctiva/sclera: Conjunctivae normal.  Cardiovascular:     Rate and Rhythm: Normal rate and regular rhythm.     Pulses: Normal pulses.     Heart sounds: Normal heart sounds.  Pulmonary:     Effort: Pulmonary effort is normal.     Breath sounds: Normal breath sounds.  Abdominal:     General: Abdomen is flat.     Palpations: Abdomen is soft.     Tenderness: There is abdominal tenderness in the right upper quadrant.  Skin:    General: Skin is warm and dry.  Neurological:     General: No focal deficit present.  Psychiatric:        Mood and Affect: Mood normal.     ED Results / Procedures / Treatments   Labs (all labs ordered are listed, but only abnormal results are displayed) Labs Reviewed  LIPASE, BLOOD - Abnormal; Notable for the following components:      Result Value  Lipase <10 (*)    All other components within normal limits  COMPREHENSIVE METABOLIC PANEL - Abnormal; Notable for the following components:   Potassium 3.4 (*)    Glucose, Bld 132 (*)    AST 11 (*)    All other components within normal limits  CBC - Abnormal; Notable for the following components:   HCT 35.7 (*)    All other components within normal limits  URINALYSIS, ROUTINE W REFLEX MICROSCOPIC - Abnormal; Notable for the following components:   Ketones, ur 15 (*)    Protein, ur 30 (*)    All other components within normal limits    EKG None  Radiology CT Abdomen Pelvis W Contrast  Result Date: 02/01/2022 CLINICAL DATA:  Abdominal pain EXAM: CT ABDOMEN AND PELVIS WITH CONTRAST TECHNIQUE: Multidetector CT imaging of the abdomen and pelvis was performed using the standard protocol following bolus administration of intravenous contrast. RADIATION DOSE  REDUCTION: This exam was performed according to the departmental dose-optimization program which includes automated exposure control, adjustment of the mA and/or kV according to patient size and/or use of iterative reconstruction technique. CONTRAST:  39mL OMNIPAQUE IOHEXOL 300 MG/ML  SOLN COMPARISON:  None Available. FINDINGS: Lower chest: No acute abnormality Hepatobiliary: Large gallstone within the gallbladder measuring a to 2.7 cm. There is gallbladder wall thickening measuring up to 11 mm. No focal hepatic abnormality. Pancreas: No focal abnormality or ductal dilatation. Spleen: No focal abnormality.  Normal size. 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 No renal or adrenal mass. No hydronephrosis. Urinary bladder unremarkable. Stomach/Bowel: Sigmoid diverticulosis. No active diverticulitis. Normal appendix. Stomach and small bowel unremarkable. Vascular/Lymphatic: No evidence of aneurysm or adenopathy. Reproductive: Uterus and adnexa unremarkable.  No mass. Other: No free fluid or free air. Musculoskeletal: No acute bony abnormality. IMPRESSION: 2.7 cm gallstone. Marked gallbladder wall thickening measuring up to 11 mm. Appearance is concerning for possible cholecystitis. Recommend clinical correlation. This could be further evaluated with ultrasound if felt clinically indicated. 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 No stones or hydronephrosis. Electronically Signed   By: Rolm Baptise M.D.   On: 02/01/2022 18:38   US Abdomen Limited RUQ (LIVER/GB)  Result Date: 02/01/2022 CLINICAL DATA:  Right upper quadrant pain. EXAM: ULTRASOUND ABDOMEN LIMITED RIGHT UPPER QUADRANT COMPARISON:  None Available. FINDINGS: Gallbladder: Gallstone is identified measuring 2.5 cm in the neck of the gallbladder. The gallbladder wall is thickened and heterogeneous measuring up to 11 mm. Pericholecystic fluid is present. Ring down artifact is present. Gallbladder sludge is  present. Sonographic Percell Miller sign is positive per sonographer. Common bile duct: Diameter: 2 mm. Liver: No focal lesion identified. Within normal limits in parenchymal echogenicity. Portal vein is patent on color Doppler imaging with normal direction of blood flow towards the liver. Other: None. IMPRESSION: 1. Cholelithiasis with additional sonographic compatible with acute cholecystitis. There some ring down artifact in the gallbladder which can be seen in the setting of emphysematous cholecystitis. Immediate surgical consultation recommended. Electronically Signed   By: Ronney Asters M.D.   On: 02/01/2022 17:10    Procedures Procedures    Medications Ordered in ED Medications  iohexol (OMNIPAQUE) 300 MG/ML solution 100 mL (80 mLs Intravenous Contrast Given 02/01/22 1813)    ED Course/ Medical Decision Making/ A&P                           Medical Decision Making Amount and/or Complexity of Data  Reviewed Labs: ordered. Radiology: ordered.   This patient presents to the ED for concern of abdominal pain, this involves a number of treatment options, and is a complaint that carries with it a high risk of complications and morbidity.  The differential diagnosis includes gallbladder pathology, pyelonephritis, diverticulitis, ACS, and PE.   Co morbidities: Discussed in HPI      EMR reviewed including pt PMHx, past surgical history and past visits to ER.   See HPI for more details   Lab Tests:   I ordered and independently interpreted labs. Labs notable for hyperglycemia   Imaging Studies:  Abnormal findings. I personally reviewed all imaging studies. Imaging notable for gallstones and cholecystitis.    Cardiac Monitoring:  The patient was maintained on a cardiac monitor.  I personally viewed and interpreted the cardiac monitored which showed an underlying rhythm of:NSR NA   Medicines ordered:  No medications were ordered. Reevaluation of the patient after these  medicines showed that the patient stayed the same I have reviewed the patients home medicines and have made adjustments as needed     Consults/Attending Physician   I requested consultation with Dr. Bedelia Person,  and discussed lab and imaging findings as well as pertinent plan - they recommend: Admission to the hospital for management for ongoing cholecystitis.   Reevaluation:  After the interventions noted above I re-evaluated patient and found that they have :stayed the same    Problem List / ED Course:  Patient presented with right upper quadrant abdominal pain.  CT revealed large gallstone and concerning findings for cholecystitis.  Consulted surgery who advised to admit to hospital for ongoing management possible surgical intervention.  Upon reevaluation her pain in the right upper quadrant was about the same which was mild to palpation per patient.    Dispostion:  After consideration of the diagnostic results and the patients response to treatment, I feel that the patent would benefit from admission to the hospital with general surgery for ongoing management for acute cholecystitis.        Final Clinical Impression(s) / ED Diagnoses Final diagnoses:  Calculus of gallbladder with acute cholecystitis without obstruction    Rx / DC Orders ED Discharge Orders     None         Collins, Dimaria, PA-C 02/01/22 Arlis Porta, MD 02/03/22 1514

## 2022-02-01 NOTE — Telephone Encounter (Signed)
Colon 09/05, no biopsy taken. Has history of IBS.  Called last night and was advised to take simethicone by Dr. Lavon Paganini.  Called back with consistent pain.   She has had constant lower right side periumbilical pain, no radiation. Not worse with movement, not worse with food, not better after BM.  Yesterday 3 loose stools, darker stools but not tarry.  Has had fever since Friday, Tmax 101, most recent 99.7.  Denies Nausea, vomiting.   With fevers and persistent abdominal pain, instructed patient to go to ER. Patient states she would prefer to go to [facility].

## 2022-02-01 NOTE — ED Triage Notes (Addendum)
Pt presents with Right lower abd pain radiating to her Right flank area and starting Friday. On call RN advised her to take Tylenol for relief. Pt reports having a colonoscopy Tuesday, September 5th   Pt denies N/V/D   Pt states she has had several BN's this morning and passing gas

## 2022-02-01 NOTE — H&P (Signed)
Reason for Consult/Chief Complaint:acute cholecystitis Consultant: Roxan Hockey, [place]  Natalie Hudson is an 61 y.o. female.   HPI: 69F with epigastric and RUQ pain 01/30/2022. One prior episode of pain last Wed around the time of her colonoscopy, which she reports was normal CS 23y ago, no other abdominal surgery.    Past Medical History:  Diagnosis Date   Allergy    Anxiety    Diabetes mellitus without complication (HCC)    GERD (gastroesophageal reflux disease)    Hemorrhoid    Hiatal hernia    IBS (irritable bowel syndrome)    Spherocytosis, hereditary (HCC)     Past Surgical History:  Procedure Laterality Date   CESAREAN SECTION  1999   TUBAL LIGATION  1999    Family History  Problem Relation Age of Onset   Diabetes Mother    Cancer Father    Diabetes Sister    Breast cancer Half-Sister    Colon cancer Neg Hx    Colon polyps Neg Hx    Esophageal cancer Neg Hx    Rectal cancer Neg Hx    Stomach cancer Neg Hx     Social History:  reports that she has never smoked. She has never used smokeless tobacco. She reports that she does not drink alcohol and does not use drugs.  Allergies:  Allergies  Allergen Reactions   Escitalopram Oxalate     REACTION: nausea 10 mg   Sertraline Hcl     REACTION: sluggish    Medications: I have reviewed the patient's current medications.  Results for orders placed or performed during the hospital encounter of 02/01/22 (from the past 48 hour(s))  Lipase, blood     Status: Abnormal   Collection Time: 02/01/22 12:33 PM  Result Value Ref Range   Lipase <10 (L) 11 - 51 U/L    Comment: Performed at [facility], [address] [place], [place] [address]  Comprehensive metabolic panel     Status: Abnormal   Collection Time: 02/01/22 12:33 PM  Result Value Ref Range   Sodium 139 135 - 145 mmol/L   Potassium 3.4 (L) 3.5 - 5.1 mmol/L   Chloride 101 98 - 111 mmol/L   CO2 29 22 - 32 mmol/L   Glucose, Bld 132 (H) 70  - 99 mg/dL    Comment: Glucose reference range applies only to samples taken after fasting for at least 8 hours.   BUN 11 8 - 23 mg/dL   Creatinine, Ser 1.91 0.44 - 1.00 mg/dL   Calcium 9.6 8.9 - 47.8 mg/dL   Total Protein 7.2 6.5 - 8.1 g/dL   Albumin 4.2 3.5 - 5.0 g/dL   AST 11 (L) 15 - 41 U/L   ALT 10 0 - 44 U/L   Alkaline Phosphatase 52 38 - 126 U/L   Total Bilirubin 0.7 0.3 - 1.2 mg/dL   GFR, Estimated >29 >56 mL/min    Comment: (NOTE) Calculated using the CKD-EPI Creatinine Equation (2021)    Anion gap 9 5 - 15    Comment: Performed at [facility], [address], [place], [place] [address]  CBC     Status: Abnormal   Collection Time: 02/01/22 12:33 PM  Result Value Ref Range   WBC 7.3 4.0 - 10.5 K/uL   RBC 3.99 3.87 - 5.11 MIL/uL   Hemoglobin 12.1 12.0 - 15.0 g/dL   HCT 65.7 (L) 84.6 - 96.2 %   MCV 89.5 80.0 - 100.0 fL  MCH 30.3 26.0 - 34.0 pg   MCHC 33.9 30.0 - 36.0 g/dL   RDW 40.9 81.1 - 91.4 %   Platelets 200 150 - 400 K/uL   nRBC 0.0 0.0 - 0.2 %    Comment: Performed at [facility], [address], [place], [place] [address]  Urinalysis, Routine w reflex microscopic Urine, Clean Catch     Status: Abnormal   Collection Time: 02/01/22 12:40 PM  Result Value Ref Range   Color, Urine YELLOW YELLOW   APPearance CLEAR CLEAR   Specific Gravity, Urine 1.028 1.005 - 1.030   pH 6.0 5.0 - 8.0   Glucose, UA NEGATIVE NEGATIVE mg/dL   Hgb urine dipstick NEGATIVE NEGATIVE   Bilirubin Urine NEGATIVE NEGATIVE   Ketones, ur 15 (A) NEGATIVE mg/dL   Protein, ur 30 (A) NEGATIVE mg/dL   Nitrite NEGATIVE NEGATIVE   Leukocytes,Ua NEGATIVE NEGATIVE   RBC / HPF 0-5 0 - 5 RBC/hpf   WBC, UA 0-5 0 - 5 WBC/hpf   Squamous Epithelial / LPF 0-5 0 - 5   Mucus PRESENT     Comment: Performed at [facility], [address] [place], [place] [address]  CBG monitoring, ED     Status: Abnormal   Collection Time: 02/01/22  7:39 PM   Result Value Ref Range   Glucose-Capillary 101 (H) 70 - 99 mg/dL    Comment: Glucose reference range applies only to samples taken after fasting for at least 8 hours.    CT Abdomen Pelvis W Contrast  Result Date: 02/01/2022 CLINICAL DATA:  Abdominal pain EXAM: CT ABDOMEN AND PELVIS WITH CONTRAST TECHNIQUE: Multidetector CT imaging of the abdomen and pelvis was performed using the standard protocol following bolus administration of intravenous contrast. RADIATION DOSE REDUCTION: This exam was performed according to the departmental dose-optimization program which includes automated exposure control, adjustment of the mA and/or kV according to patient size and/or use of iterative reconstruction technique. CONTRAST:  55mL OMNIPAQUE IOHEXOL 300 MG/ML  SOLN COMPARISON:  None Available. FINDINGS: Lower chest: No acute abnormality Hepatobiliary: Large gallstone within the gallbladder measuring a to 2.7 cm. There is gallbladder wall thickening measuring up to 11 mm. No focal hepatic abnormality. Pancreas: No focal abnormality or ductal dilatation. Spleen: No focal abnormality.  Normal size. 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 No renal or adrenal mass. No hydronephrosis. Urinary bladder unremarkable. Stomach/Bowel: Sigmoid diverticulosis. No active diverticulitis. Normal appendix. Stomach and small bowel unremarkable. Vascular/Lymphatic: No evidence of aneurysm or adenopathy. Reproductive: Uterus and adnexa unremarkable.  No mass. Other: No free fluid or free air. Musculoskeletal: No acute bony abnormality. IMPRESSION: 2.7 cm gallstone. Marked gallbladder wall thickening measuring up to 11 mm. Appearance is concerning for possible cholecystitis. Recommend clinical correlation. This could be further evaluated with ultrasound if felt clinically indicated. 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 No stones or hydronephrosis. Electronically Signed   By: Charlett Nose  M.D.   On: 02/01/2022 18:38   US Abdomen Limited RUQ (LIVER/GB)  Result Date: 02/01/2022 CLINICAL DATA:  Right upper quadrant pain. EXAM: ULTRASOUND ABDOMEN LIMITED RIGHT UPPER QUADRANT COMPARISON:  None Available. FINDINGS: Gallbladder: Gallstone is identified measuring 2.5 cm in the neck of the gallbladder. The gallbladder wall is thickened and heterogeneous measuring up to 11 mm. Pericholecystic fluid is present. Ring down artifact is present. Gallbladder sludge is present. Sonographic Eulah Pont sign is positive per sonographer. Common bile duct: Diameter: 2 mm. Liver: No focal lesion  identified. Within normal limits in parenchymal echogenicity. Portal vein is patent on color Doppler imaging with normal direction of blood flow towards the liver. Other: None. IMPRESSION: 1. Cholelithiasis with additional sonographic compatible with acute cholecystitis. There some ring down artifact in the gallbladder which can be seen in the setting of emphysematous cholecystitis. Immediate surgical consultation recommended. Electronically Signed   By: Darliss Cheney M.D.   On: 02/01/2022 17:10    ROS 10 point review of systems is negative except as listed above in HPI.   Physical Exam Blood pressure (!) 118/56, pulse 93, temperature 98.4 F (36.9 C), temperature source Oral, resp. rate 20, height 5' (1.524 m), weight 61.2 kg, SpO2 98 %. Constitutional: well-developed, well-nourished HEENT: pupils equal, round, reactive to light, 59mm b/l, moist conjunctiva, external inspection of ears and nose normal, hearing intact Oropharynx: normal oropharyngeal mucosa, normal dentition Neck: no thyromegaly, trachea midline, no midline cervical tenderness to palpation Chest: breath sounds equal bilaterally, normal respiratory effort, no midline or lateral chest wall tenderness to palpation/deformity Abdomen: soft, NT, no bruising, no hepatosplenomegaly GU: normal female genitalia  Back: no wounds, no thoracic/lumbar spine  tenderness to palpation, no thoracic/lumbar spine stepoffs Rectal: deferred Extremities: 2+ radial and pedal pulses bilaterally, intact motor and sensation bilateral UE and LE, no peripheral edema MSK: unable to assess gait/station, no clubbing/cyanosis of fingers/toes, normal ROM of all four extremities Skin: warm, dry, no rashes Psych: normal memory, normal mood/affect     Assessment/Plan: 77F with acute cholecystitis. Recommend lap chole. Informed consent was obtained after detailed explanation of risks, including bleeding, infection, biloma, hematoma, injury to common bile duct, need for IOC to delineate anatomy, and need for conversion to open procedure. All questions answered to the patient's satisfaction.   Diamantina Monks, MD General and Trauma Surgery [facility]

## 2022-02-02 ENCOUNTER — Encounter (HOSPITAL_COMMUNITY)
Admission: EM | Disposition: A | Discharge status: Home / Self Care | Payer: Self-pay | Source: Home / Self Care | Attending: Emergency Medicine

## 2022-02-02 ENCOUNTER — Other Ambulatory Visit: Payer: Self-pay

## 2022-02-02 ENCOUNTER — Observation Stay (HOSPITAL_COMMUNITY): Payer: Commercial Managed Care - HMO

## 2022-02-02 ENCOUNTER — Encounter (HOSPITAL_COMMUNITY): Payer: Self-pay

## 2022-02-02 ENCOUNTER — Observation Stay (HOSPITAL_BASED_OUTPATIENT_CLINIC_OR_DEPARTMENT_OTHER): Payer: Commercial Managed Care - HMO

## 2022-02-02 DIAGNOSIS — K8000 Calculus of gallbladder with acute cholecystitis without obstruction: Secondary | ICD-10-CM

## 2022-02-02 HISTORY — PX: CHOLECYSTECTOMY: SHX55

## 2022-02-02 LAB — COMPREHENSIVE METABOLIC PANEL
ALT: 11 U/L (ref 0–44)
AST: 13 U/L — ABNORMAL LOW (ref 15–41)
Albumin: 3 g/dL — ABNORMAL LOW (ref 3.5–5.0)
Alkaline Phosphatase: 44 U/L (ref 38–126)
Anion gap: 9 (ref 5–15)
BUN: 7 mg/dL — ABNORMAL LOW (ref 8–23)
CO2: 27 mmol/L (ref 22–32)
Calcium: 8.5 mg/dL — ABNORMAL LOW (ref 8.9–10.3)
Chloride: 102 mmol/L (ref 98–111)
Creatinine, Ser: 0.66 mg/dL (ref 0.44–1.00)
GFR, Estimated: >60 mL/min (ref >60)
Glucose, Bld: 238 mg/dL — ABNORMAL HIGH (ref 70–99)
Potassium: 3 mmol/L — ABNORMAL LOW (ref 3.5–5.1)
Sodium: 138 mmol/L (ref 135–145)
Total Bilirubin: 0.2 mg/dL — ABNORMAL LOW (ref 0.3–1.2)
Total Protein: 5.9 g/dL — ABNORMAL LOW (ref 6.5–8.1)

## 2022-02-02 LAB — CBC
HCT: 31.5 % — ABNORMAL LOW (ref 36.0–46.0)
Hemoglobin: 11 g/dL — ABNORMAL LOW (ref 12.0–15.0)
MCH: 31.1 pg (ref 26.0–34.0)
MCHC: 34.9 g/dL (ref 30.0–36.0)
MCV: 89 fL (ref 80.0–100.0)
Platelets: 188 10*3/uL (ref 150–400)
RBC: 3.54 MIL/uL — ABNORMAL LOW (ref 3.87–5.11)
RDW: 12 % (ref 11.5–15.5)
WBC: 6 10*3/uL (ref 4.0–10.5)
nRBC: 0 % (ref 0.0–0.2)

## 2022-02-02 LAB — GLUCOSE, CAPILLARY
Glucose-Capillary: 125 mg/dL — ABNORMAL HIGH (ref 70–99)
Glucose-Capillary: 156 mg/dL — ABNORMAL HIGH (ref 70–99)

## 2022-02-02 LAB — SURGICAL PCR SCREEN
MRSA, PCR: NEGATIVE
Staphylococcus aureus: NEGATIVE

## 2022-02-02 SURGERY — LAPAROSCOPIC CHOLECYSTECTOMY
Anesthesia: General | Site: Abdomen

## 2022-02-02 MED ORDER — ONDANSETRON HCL 4 MG/2ML IJ SOLN
INTRAMUSCULAR | Status: AC
  Filled 2022-02-02: qty 2

## 2022-02-02 MED ORDER — LIDOCAINE 2% (20 MG/ML) 5 ML SYRINGE
INTRAMUSCULAR | Status: DC | PRN
  Administered 2022-02-02: 60 mg via INTRAVENOUS

## 2022-02-02 MED ORDER — PROPOFOL 10 MG/ML IV BOLUS
INTRAVENOUS | Status: DC | PRN
  Administered 2022-02-02: 100 mg via INTRAVENOUS

## 2022-02-02 MED ORDER — FENTANYL CITRATE (PF) 250 MCG/5ML IJ SOLN
INTRAMUSCULAR | Status: AC
  Filled 2022-02-02: qty 5

## 2022-02-02 MED ORDER — DEXAMETHASONE SODIUM PHOSPHATE 10 MG/ML IJ SOLN
INTRAMUSCULAR | Status: DC | PRN
  Administered 2022-02-02: 5 mg via INTRAVENOUS

## 2022-02-02 MED ORDER — FENTANYL CITRATE (PF) 100 MCG/2ML IJ SOLN
INTRAMUSCULAR | Status: DC | PRN
  Administered 2022-02-02: 50 ug via INTRAVENOUS
  Administered 2022-02-02: 100 ug via INTRAVENOUS
  Administered 2022-02-02: 50 ug via INTRAVENOUS

## 2022-02-02 MED ORDER — LACTATED RINGERS IV SOLN
INTRAVENOUS | Status: DC
Start: 2022-02-02 — End: 2022-02-02

## 2022-02-02 MED ORDER — LIDOCAINE 2% (20 MG/ML) 5 ML SYRINGE
INTRAMUSCULAR | Status: AC
  Filled 2022-02-02: qty 5

## 2022-02-02 MED ORDER — BUPIVACAINE-EPINEPHRINE (PF) 0.25% -1:200000 IJ SOLN
INTRAMUSCULAR | Status: AC
  Filled 2022-02-02: qty 30

## 2022-02-02 MED ORDER — 0.9 % SODIUM CHLORIDE (POUR BTL) OPTIME
TOPICAL | Status: DC | PRN
  Administered 2022-02-02: 1000 mL

## 2022-02-02 MED ORDER — ORAL CARE MOUTH RINSE: 15.0000 mL | Freq: Once | OROMUCOSAL | Status: AC

## 2022-02-02 MED ORDER — MIDAZOLAM HCL 2 MG/2ML IJ SOLN
INTRAMUSCULAR | Status: DC | PRN
  Administered 2022-02-02: 1 mg via INTRAVENOUS

## 2022-02-02 MED ORDER — FENTANYL CITRATE (PF) 100 MCG/2ML IJ SOLN
25.0000 ug | INTRAMUSCULAR | Status: DC | PRN
  Administered 2022-02-02: 25 ug via INTRAVENOUS

## 2022-02-02 MED ORDER — INSULIN ASPART 100 UNIT/ML IJ SOLN: 0.0000 [IU] | INTRAMUSCULAR | Status: DC | PRN

## 2022-02-02 MED ORDER — SUGAMMADEX SODIUM 200 MG/2ML IV SOLN
INTRAVENOUS | Status: DC | PRN
  Administered 2022-02-02: 150 mg via INTRAVENOUS

## 2022-02-02 MED ORDER — PROPOFOL 10 MG/ML IV BOLUS
INTRAVENOUS | Status: AC
  Filled 2022-02-02: qty 20

## 2022-02-02 MED ORDER — DEXAMETHASONE SODIUM PHOSPHATE 10 MG/ML IJ SOLN
INTRAMUSCULAR | Status: AC
  Filled 2022-02-02: qty 1

## 2022-02-02 MED ORDER — FENTANYL CITRATE (PF) 100 MCG/2ML IJ SOLN
INTRAMUSCULAR | Status: AC
  Filled 2022-02-02: qty 2

## 2022-02-02 MED ORDER — SODIUM CHLORIDE 0.9 % IR SOLN
Status: DC | PRN
  Administered 2022-02-02: 1000 mL

## 2022-02-02 MED ORDER — ONDANSETRON HCL 4 MG/2ML IJ SOLN
INTRAMUSCULAR | Status: DC | PRN
  Administered 2022-02-02: 4 mg via INTRAVENOUS

## 2022-02-02 MED ORDER — ROCURONIUM BROMIDE 10 MG/ML (PF) SYRINGE
PREFILLED_SYRINGE | INTRAVENOUS | Status: DC | PRN
  Administered 2022-02-02: 65 mg via INTRAVENOUS

## 2022-02-02 MED ORDER — CHLORHEXIDINE GLUCONATE 0.12 % MT SOLN
15.0000 mL | Freq: Once | OROMUCOSAL | Status: AC
  Administered 2022-02-02: 15 mL via OROMUCOSAL

## 2022-02-02 MED ORDER — ROCURONIUM BROMIDE 10 MG/ML (PF) SYRINGE
PREFILLED_SYRINGE | INTRAVENOUS | Status: AC
  Filled 2022-02-02: qty 10

## 2022-02-02 MED ORDER — MIDAZOLAM HCL 2 MG/2ML IJ SOLN
INTRAMUSCULAR | Status: AC
  Filled 2022-02-02: qty 2

## 2022-02-02 MED ORDER — BUPIVACAINE-EPINEPHRINE (PF) 0.25% -1:200000 IJ SOLN
INTRAMUSCULAR | Status: DC | PRN
  Administered 2022-02-02: 7 mL

## 2022-02-02 SURGICAL SUPPLY — 47 items
ADH SKN CLS APL DERMABOND .7 (GAUZE/BANDAGES/DRESSINGS) ×1
APL PRP STRL LF DISP 70% ISPRP (MISCELLANEOUS) ×1
BAG COUNTER SPONGE SURGICOUNT (BAG) ×1 IMPLANT
BAG SPEC RTRVL 10 TROC 200 (ENDOMECHANICALS) ×1
BAG SPNG CNTER NS LX DISP (BAG) ×1
CANISTER SUCT 3000ML PPV (MISCELLANEOUS) ×1 IMPLANT
CHLORAPREP W/TINT 26 (MISCELLANEOUS) ×1 IMPLANT
CLIP LIGATING HEMO O LOK GREEN (MISCELLANEOUS) ×1 IMPLANT
COVER SURGICAL LIGHT HANDLE (MISCELLANEOUS) ×1 IMPLANT
COVER TRANSDUCER ULTRASND (DRAPES) ×1 IMPLANT
DERMABOND ADVANCED .7 DNX12 (GAUZE/BANDAGES/DRESSINGS) ×1 IMPLANT
ELECT REM PT RETURN 9FT ADLT (ELECTROSURGICAL) ×1
ELECTRODE REM PT RTRN 9FT ADLT (ELECTROSURGICAL) ×1 IMPLANT
GLOVE BIO SURGEON STRL SZ7.5 (GLOVE) ×1 IMPLANT
GLOVE SURG SYN 7.5  E (GLOVE) ×1
GLOVE SURG SYN 7.5 E (GLOVE) ×1 IMPLANT
GLOVE SURG SYN 7.5 PF PI (GLOVE) ×1 IMPLANT
GOWN STRL REUS W/ TWL LRG LVL3 (GOWN DISPOSABLE) ×2 IMPLANT
GOWN STRL REUS W/ TWL XL LVL3 (GOWN DISPOSABLE) ×1 IMPLANT
GOWN STRL REUS W/TWL LRG LVL3 (GOWN DISPOSABLE) ×2
GOWN STRL REUS W/TWL XL LVL3 (GOWN DISPOSABLE) ×1
GRASPER SUT TROCAR 14GX15 (MISCELLANEOUS) ×1 IMPLANT
KIT BASIN OR (CUSTOM PROCEDURE TRAY) ×1 IMPLANT
KIT TURNOVER KIT B (KITS) ×1 IMPLANT
NDL INSUFFLATION 14GA 120MM (NEEDLE) ×1 IMPLANT
NEEDLE INSUFFLATION 14GA 120MM (NEEDLE) ×1 IMPLANT
NS IRRIG 1000ML POUR BTL (IV SOLUTION) ×1 IMPLANT
PAD ARMBOARD 7.5X6 YLW CONV (MISCELLANEOUS) ×1 IMPLANT
POUCH LAPAROSCOPIC INSTRUMENT (MISCELLANEOUS) ×1 IMPLANT
POUCH RETRIEVAL ECOSAC 10 (ENDOMECHANICALS) IMPLANT
POUCH RETRIEVAL ECOSAC 10MM (ENDOMECHANICALS) ×1
SCISSORS LAP 5X35 DISP (ENDOMECHANICALS) ×1 IMPLANT
SET IRRIG TUBING LAPAROSCOPIC (IRRIGATION / IRRIGATOR) ×1 IMPLANT
SET TUBE SMOKE EVAC HIGH FLOW (TUBING) ×1 IMPLANT
SLEEVE ENDOPATH XCEL 5M (ENDOMECHANICALS) ×1 IMPLANT
SPECIMEN JAR SMALL (MISCELLANEOUS) ×1 IMPLANT
SUT MNCRL AB 4-0 PS2 18 (SUTURE) ×1 IMPLANT
SUT VIC AB 3-0 SH 27 (SUTURE) ×1
SUT VIC AB 3-0 SH 27X BRD (SUTURE) IMPLANT
SUT VICRYL 0 UR6 27IN ABS (SUTURE) IMPLANT
TOWEL GREEN STERILE (TOWEL DISPOSABLE) ×1 IMPLANT
TOWEL GREEN STERILE FF (TOWEL DISPOSABLE) ×1 IMPLANT
TRAY LAPAROSCOPIC MC (CUSTOM PROCEDURE TRAY) ×1 IMPLANT
TROCAR XCEL NON-BLD 11X100MML (ENDOMECHANICALS) ×1 IMPLANT
TROCAR Z-THREAD OPTICAL 5X100M (TROCAR) ×1 IMPLANT
WARMER LAPAROSCOPE (MISCELLANEOUS) ×1 IMPLANT
WATER STERILE IRR 1000ML POUR (IV SOLUTION) ×1 IMPLANT

## 2022-02-02 NOTE — Anesthesia Postprocedure Evaluation (Signed)
Anesthesia Post Note  Patient: Natalie Hudson  Procedure(s) Performed: LAPAROSCOPIC CHOLECYSTECTOMY (Abdomen)     Patient location during evaluation: PACU Anesthesia Type: General Level of consciousness: awake and alert Pain management: pain level controlled Vital Signs Assessment: post-procedure vital signs reviewed and stable Respiratory status: spontaneous breathing, nonlabored ventilation, respiratory function stable and patient connected to nasal cannula oxygen Cardiovascular status: blood pressure returned to baseline and stable Postop Assessment: no apparent nausea or vomiting Anesthetic complications: no   No notable events documented.  Last Vitals:  Vitals:   02/02/22 1527 02/02/22 1934  BP: (!) 108/96 (!) 102/56  Pulse: 84 84  Resp: 16 18  Temp: 36.6 C 36.8 C  SpO2: 95% 96%    Last Pain:  Vitals:   02/02/22 1934  TempSrc: Oral  PainSc:                  Meilech Virts L Reed Eifert

## 2022-02-02 NOTE — Anesthesia Preprocedure Evaluation (Addendum)
Anesthesia Evaluation  Patient identified by MRN, date of birth, ID band Patient awake    Reviewed: Allergy & Precautions, NPO status , Patient's Chart, lab work & pertinent test results  Airway Mallampati: I  TM Distance: >3 FB Neck ROM: Full    Dental  (+) Teeth Intact, Dental Advisory Given   Pulmonary neg pulmonary ROS,    Pulmonary exam normal breath sounds clear to auscultation       Cardiovascular negative cardio ROS Normal cardiovascular exam Rhythm:Regular Rate:Normal     Neuro/Psych PSYCHIATRIC DISORDERS Anxiety negative neurological ROS     GI/Hepatic Neg liver ROS, hiatal hernia, GERD  ,  Endo/Other  diabetes, Type 2, Oral Hypoglycemic Agents  Renal/GU negative Renal ROS  negative genitourinary   Musculoskeletal negative musculoskeletal ROS (+)   Abdominal   Peds  Hematology negative hematology ROS (+)   Anesthesia Other Findings   Reproductive/Obstetrics                            Anesthesia Physical Anesthesia Plan  ASA: 2  Anesthesia Plan: General   Post-op Pain Management: Tylenol PO (pre-op)*   Induction: Intravenous  PONV Risk Score and Plan: 3 and Midazolam, Dexamethasone and Ondansetron  Airway Management Planned: Oral ETT  Additional Equipment:   Intra-op Plan:   Post-operative Plan: Extubation in OR  Informed Consent: I have reviewed the patients History and Physical, chart, labs and discussed the procedure including the risks, benefits and alternatives for the proposed anesthesia with the patient or authorized representative who has indicated his/her understanding and acceptance.     Dental advisory given  Plan Discussed with: CRNA  Anesthesia Plan Comments:         Anesthesia Quick Evaluation

## 2022-02-02 NOTE — Progress Notes (Signed)
Mobility Specialist - Progress Note   02/02/22 1026  Mobility  Activity Ambulated independently in hallway  Level of Assistance Independent  Assistive Device None  Distance Ambulated (ft) 1100 ft  Activity Response Tolerated well  $Mobility charge 1 Mobility    Pt received in bed and agreeable to mobility. No complaints throughout. Left in bed w/ call bell in reach and all needs met.   Paulla Dolly Mobility Specialist

## 2022-02-02 NOTE — Op Note (Signed)
02/02/2022  12:22 PM  PATIENT:  Natalie Hudson  61 y.o. female  PRE-OPERATIVE DIAGNOSIS:  acute cholecystitis  POST-OPERATIVE DIAGNOSIS:  acute cholecystitis, cholelithiasis  PROCEDURE:  Procedure(s): LAPAROSCOPIC CHOLECYSTECTOMY (N/A)  SURGEON:  Surgeon(s) and Role:    * Axel Filler, MD - Primary  PHYSICIAN ASSISTANT:   ASSISTANTS: Berenda Morale, RNFA   ANESTHESIA:   local and general  EBL:  minimal   BLOOD ADMINISTERED:none  DRAINS: none   LOCAL MEDICATIONS USED:  BUPIVICAINE   SPECIMEN:  Source of Specimen:  gallbladder  DISPOSITION OF SPECIMEN:  PATHOLOGY  COUNTS:  YES  TOURNIQUET:  * No tourniquets in log *  DICTATION: .Dragon Dictation   EBL: <5cc   Complications: none   Counts: reported as correct x 2   Findings:chronic inflammation of gallbladder and large stone  Indications for procedure: Pt is a 41F with RUQ pain and seen to have gallstones.   Details of the procedure: The patient was taken to the operating and placed in the supine position with bilateral SCDs in place. A time out was called and all facts were verified. A pneumoperitoneum was obtained via A Veress needle technique to a pressure of 15mm of mercury. A 76mm trochar was then placed in the right upper quadrant under visualization, and there were no injuries to any abdominal organs. A 11 mm port was then placed in the umbilical region after infiltrating with local anesthesia under direct visualization. A second epigastric port was placed under direct visualization.   The gallbladder was identified and retracted, the peritoneum was then sharply dissected from the gallbladder and this dissection was carried down to Calot's triangle. The cystic duct was identified and dissected circumferentially and seen going into the gallbladder 360.  The cystic artery was dissected away from the surrounding tissues.   The critical angle was obtained.   2 clips were placed proximally one distally and  the cystic duct transected. The cystic artery was identified and 2 clips placed proximally and one distally and transected. We then proceeded to remove the gallbladder off the hepatic fossa with Bovie cautery. A retrieval bag was then placed in the abdomen and gallbladder placed in the bag. The hepatic fossa was then reexamined and hemostasis was achieved with Bovie cautery and was excellent at this portion of the case. The subhepatic fossa and perihepatic fossa was then irrigated until the effluent was clear. The specimen bag and specimen were removed from the abdominal cavity.  The 11 mm trocar fascia was reapproximated with the Endo Close #1 Vicryl x4. The pneumoperitoneum was evacuated and all trochars removed under direct visulalization. The skin was then closed with 4-0 Monocryl and the skin dressed with Dermabond. The patient was awaken from general anesthesia and taken to the recovery room in stable condition.   PLAN OF CARE: Discharge to home after PACU  PATIENT DISPOSITION:  PACU - hemodynamically stable.   Delay start of Pharmacological VTE agent (>24hrs) due to surgical blood loss or risk of bleeding: not applicable

## 2022-02-02 NOTE — TOC CM/SW Note (Signed)
  Transition of Care [facility]) Screening Note   Patient Details  Name: Natalie Hudson Date of Birth: [birth year]     Transition of Care Department [facility]) has reviewed patient and no TOC needs have been identified at this time. We will continue to monitor patient advancement through interdisciplinary progression rounds. If new patient transition needs arise, please place a TOC consult.

## 2022-02-02 NOTE — Anesthesia Procedure Notes (Signed)
Procedure Name: Intubation Date/Time: 02/02/2022 11:30 AM  Performed by: Barrington Ellison, CRNAPre-anesthesia Checklist: Patient identified, Emergency Drugs available, Suction available and Patient being monitored Patient Re-evaluated:Patient Re-evaluated prior to induction Oxygen Delivery Method: Circle System Utilized Preoxygenation: Pre-oxygenation with 100% oxygen Induction Type: IV induction Ventilation: Mask ventilation without difficulty and Oral airway inserted - appropriate to patient size Laryngoscope Size: Mac and 3 Grade View: Grade I Tube type: Oral Tube size: 7.0 mm Number of attempts: 1 Airway Equipment and Method: Stylet and Oral airway Placement Confirmation: ETT inserted through vocal cords under direct vision, positive ETCO2 and breath sounds checked- equal and bilateral Secured at: 20 cm Tube secured with: Tape Dental Injury: Teeth and Oropharynx as per pre-operative assessment

## 2022-02-02 NOTE — Transfer of Care (Signed)
Immediate Anesthesia Transfer of Care Note  Patient: Natalie Hudson  Procedure(s) Performed: LAPAROSCOPIC CHOLECYSTECTOMY (Abdomen)  Patient Location: PACU  Anesthesia Type:General  Level of Consciousness: awake and oriented  Airway & Oxygen Therapy: Patient Spontanous Breathing  Post-op Assessment: Report given to RN  Post vital signs: Reviewed and stable  Last Vitals:  Vitals Value Taken Time  BP 111/62 02/02/22 1245  Temp    Pulse 89 02/02/22 1245  Resp 18 02/02/22 1245  SpO2 93 % 02/02/22 1245  Vitals shown include unvalidated device data.  Last Pain:  Vitals:   02/02/22 1059  TempSrc: Oral  PainSc:          Complications: No notable events documented.

## 2022-02-02 NOTE — Discharge Instructions (Signed)
[facility]  Please arrive at least 30 min before your appointment to complete your check in paperwork.  If you are unable to arrive 30 min prior to your appointment time we may have to cancel or reschedule you. LAPAROSCOPIC SURGERY: POST OP INSTRUCTIONS Always review your discharge instruction sheet given to you by the facility where your surgery was performed. IF YOU HAVE DISABILITY OR FAMILY LEAVE FORMS, YOU MUST BRING THEM TO THE OFFICE FOR PROCESSING.   DO NOT GIVE THEM TO YOUR DOCTOR.  PAIN CONTROL  First take acetaminophen (Tylenol) AND/or ibuprofen (Advil) to control your pain after surgery.  Follow directions on package.  Taking acetaminophen (Tylenol) and/or ibuprofen (Advil) regularly after surgery will help to control your pain and lower the amount of prescription pain medication you may need.  You should not take more than 4,000 mg (4 grams) of acetaminophen (Tylenol) in 24 hours.  You should not take ibuprofen (Advil), aleve, motrin, naprosyn or other NSAIDS if you have a history of stomach ulcers or chronic kidney disease.  A prescription for pain medication may be given to you upon discharge.  Take your pain medication as prescribed, if you still have uncontrolled pain after taking acetaminophen (Tylenol) or ibuprofen (Advil). Use ice packs to help control pain. If you need a refill on your pain medication, please contact your pharmacy.  They will contact our office to request authorization. Prescriptions will not be filled after 5pm or on week-ends.  HOME MEDICATIONS Take your usually prescribed medications unless otherwise directed.  DIET You should follow a light diet the first few days after arrival home.  Be sure to include lots of fluids daily. Avoid fatty, fried foods.   CONSTIPATION It is common to experience some constipation after surgery and if you are taking pain medication.  Increasing fluid intake and taking a stool softener (such as Colace)  will usually help or prevent this problem from occurring.  A mild laxative (Milk of Magnesia or Miralax) should be taken according to package instructions if there are no bowel movements after 48 hours.  WOUND/INCISION CARE Most patients will experience some swelling and bruising in the area of the incisions.  Ice packs will help.  Swelling and bruising can take several days to resolve.  Unless discharge instructions indicate otherwise, follow guidelines below  STERI-STRIPS - you may remove your outer bandages 48 hours after surgery, and you may shower at that time.  You have steri-strips (small skin tapes) in place directly over the incision.  These strips should be left on the skin for 7-10 days.   DERMABOND/SKIN GLUE - you may shower in 24 hours.  The glue will flake off over the next 2-3 weeks. Any sutures or staples will be removed at the office during your follow-up visit.  ACTIVITIES You may resume regular (light) daily activities beginning the next day--such as daily self-care, walking, climbing stairs--gradually increasing activities as tolerated.  You may have sexual intercourse when it is comfortable.  Refrain from any heavy lifting or straining until approved by your doctor. You may drive when you are no longer taking prescription pain medication, you can comfortably wear a seatbelt, and you can safely maneuver your car and apply brakes.  FOLLOW-UP You should see your doctor in the office for a follow-up appointment approximately 2-3 weeks after your surgery.  You should have been given your post-op/follow-up appointment when your surgery was scheduled.  If you did not receive a post-op/follow-up appointment, make sure   that you call for this appointment within a day or two after you arrive home to insure a convenient appointment time.  OTHER INSTRUCTIONS  WHEN TO CALL YOUR DOCTOR: Fever over 101.0 Inability to urinate Continued bleeding from incision. Increased pain, redness, or  drainage from the incision. Increasing abdominal pain  The clinic staff is available to answer your questions during regular business hours.  Please don't hesitate to call and ask to speak to one of the nurses for clinical concerns.  If you have a medical emergency, go to the nearest emergency room or call 911.  A surgeon from [facility] is always on call at the hospital. [address], [address], [place], [place]  [address] ? [address] [address], [place], [place]   [address] [phone] ? [phone] ? FAX [phone]      Managing Your Pain After Surgery Without Opioids    Thank you for participating in our program to help patients manage their pain after surgery without opioids. This is part of our effort to provide you with the best care possible, without exposing you or your family to the risk that opioids pose.  What pain can I expect after surgery? You can expect to have some pain after surgery. This is normal. The pain is typically worse the day after surgery, and quickly begins to get better. Many studies have found that many patients are able to manage their pain after surgery with Over-the-Counter (OTC) medications such as Tylenol and Motrin. If you have a condition that does not allow you to take Tylenol or Motrin, notify your surgical team.  How will I manage my pain? The best strategy for controlling your pain after surgery is around the clock pain control with Tylenol (acetaminophen) and Motrin (ibuprofen or Advil). Alternating these medications with each other allows you to maximize your pain control. In addition to Tylenol and Motrin, you can use heating pads or ice packs on your incisions to help reduce your pain.  How will I alternate your regular strength over-the-counter pain medication? You will take a dose of pain medication every three hours. Start by taking 650 mg of Tylenol (2 pills of 325 mg) 3 hours later take 600 mg of Motrin (3 pills of 200  mg) 3 hours after taking the Motrin take 650 mg of Tylenol 3 hours after that take 600 mg of Motrin.   - 1 -  See example - if your first dose of Tylenol is at 12:00 PM   12:00 PM Tylenol 650 mg (2 pills of 325 mg)  3:00 PM Motrin 600 mg (3 pills of 200 mg)  6:00 PM Tylenol 650 mg (2 pills of 325 mg)  9:00 PM Motrin 600 mg (3 pills of 200 mg)  Continue alternating every 3 hours   We recommend that you follow this schedule around-the-clock for at least 3 days after surgery, or until you feel that it is no longer needed. Use the table on the last page of this handout to keep track of the medications you are taking. Important: Do not take more than 3000mg of Tylenol or 3200mg of Motrin in a 24-hour period. Do not take ibuprofen/Motrin if you have a history of bleeding stomach ulcers, severe kidney disease, &/or actively taking a blood thinner  What if I still have pain? If you have pain that is not controlled with the over-the-counter pain medications (Tylenol and Motrin or Advil) you might have what we call "breakthrough" pain. You will   receive a prescription for a small amount of an opioid pain medication such as Oxycodone, Tramadol, or Tylenol with Codeine. Use these opioid pills in the first 24 hours after surgery if you have breakthrough pain. Do not take more than 1 pill every 4-6 hours.  If you still have uncontrolled pain after using all opioid pills, don't hesitate to call our staff using the number provided. We will help make sure you are managing your pain in the best way possible, and if necessary, we can provide a prescription for additional pain medication.   Day 1    Time  Name of Medication Number of pills taken  Amount of Acetaminophen  Pain Level   Comments  AM PM       AM PM       AM PM       AM PM       AM PM       AM PM       AM PM       AM PM       Total Daily amount of Acetaminophen Do not take more than  3,000 mg per day      Day 2    Time  Name  of Medication Number of pills taken  Amount of Acetaminophen  Pain Level   Comments  AM PM       AM PM       AM PM       AM PM       AM PM       AM PM       AM PM       AM PM       Total Daily amount of Acetaminophen Do not take more than  3,000 mg per day      Day 3    Time  Name of Medication Number of pills taken  Amount of Acetaminophen  Pain Level   Comments  AM PM       AM PM       AM PM       AM PM         AM PM       AM PM       AM PM       AM PM       Total Daily amount of Acetaminophen Do not take more than  3,000 mg per day      Day 4    Time  Name of Medication Number of pills taken  Amount of Acetaminophen  Pain Level   Comments  AM PM       AM PM       AM PM       AM PM       AM PM       AM PM       AM PM       AM PM       Total Daily amount of Acetaminophen Do not take more than  3,000 mg per day      Day 5    Time  Name of Medication Number of pills taken  Amount of Acetaminophen  Pain Level   Comments  AM PM       AM PM       AM PM       AM PM       AM PM         AM PM       AM PM       AM PM       Total Daily amount of Acetaminophen Do not take more than  3,000 mg per day      Day 6    Time  Name of Medication Number of pills taken  Amount of Acetaminophen  Pain Level  Comments  AM PM       AM PM       AM PM       AM PM       AM PM       AM PM       AM PM       AM PM       Total Daily amount of Acetaminophen Do not take more than  3,000 mg per day      Day 7    Time  Name of Medication Number of pills taken  Amount of Acetaminophen  Pain Level   Comments  AM PM       AM PM       AM PM       AM PM       AM PM       AM PM       AM PM       AM PM       Total Daily amount of Acetaminophen Do not take more than  3,000 mg per day        For additional information about how and where to safely dispose of unused opioid medications - [employer]  Disclaimer: This  document contains information and/or instructional materials adapted from [place] for the typical patient with your condition. It does not replace medical advice from your health care provider because your experience may differ from that of the typical patient. Talk to your health care provider if you have any questions about this document, your condition or your treatment plan. Adapted from [place]

## 2022-02-03 ENCOUNTER — Encounter (HOSPITAL_COMMUNITY): Payer: Self-pay | Admitting: General Surgery

## 2022-02-03 LAB — HIV ANTIBODY (ROUTINE TESTING W REFLEX): HIV Screen 4th Generation wRfx: NONREACTIVE

## 2022-02-03 LAB — SURGICAL PATHOLOGY

## 2022-02-03 MED ORDER — ACETAMINOPHEN 500 MG PO TABS: 1000.0000 mg | ORAL_TABLET | Freq: Four times a day (QID) | ORAL | 0 refills | Status: AC | PRN

## 2022-02-03 MED ORDER — OXYCODONE HCL 5 MG PO TABS: 5.0000 mg | ORAL_TABLET | ORAL | 0 refills | Status: DC | PRN

## 2022-02-03 NOTE — Progress Notes (Signed)
Mobility Specialist - Progress Note   02/03/22 0910  Mobility  Activity Ambulated independently in hallway  Level of Assistance Independent  Assistive Device None  Distance Ambulated (ft) 200 ft  Activity Response Tolerated well  $Mobility charge 1 Mobility    Pt received in bed agreeable to mobility. No complaints throughout, no physical assistance needed. Left in bed w/ call bell in reach and all needs met.   Paulla Dolly Mobility Specialist

## 2022-02-03 NOTE — Progress Notes (Signed)
Discharge instructions, RX's and follow up appts explained and provided to patient and caregiver verbalized understanding. Patient left floor via wheelchair accompanied by staff no c/o pain or shortness of breath at d/c.  Eldredge Veldhuizen Lynn, RN  

## 2022-02-03 NOTE — Plan of Care (Signed)
  Problem: Education: Goal: Knowledge of General Education information will improve Description Including pain rating scale, medication(s)/side effects and non-pharmacologic comfort measures Outcome: Progressing   Problem: Health Behavior/Discharge Planning: Goal: Ability to manage health-related needs will improve Outcome: Progressing   

## 2022-02-03 NOTE — Plan of Care (Signed)

## 2022-02-03 NOTE — Discharge Summary (Signed)
/     Patient ID: Natalie Hudson [id number] y.o.  Admit date: 02/01/2022 Discharge date: 02/03/2022  Admitting Diagnosis: cholecystitis  Discharge Diagnosis Patient Active Problem List   Diagnosis Date Noted   Acute cholecystitis 02/01/2022   Cholecystitis 02/01/2022   Dizziness 11/03/2021   Tingling 11/03/2021   Cyst of joint of hand, right 09/04/2021   Hyperlipidemia associated with type 2 diabetes mellitus (HCC) 04/26/2020   Decreased hearing, bilateral 01/23/2020   Left-sided chest wall pain 09/20/2019   Epigastric pain 09/20/2019   COVID-19 virus infection 05/23/2019   Type 2 diabetes mellitus with other specified complication (HCC) 05/08/2013   BPPV (benign paroxysmal positional vertigo) 09/09/2012   IBS (irritable bowel syndrome) 06/26/2011   ANXIETY 07/27/2007   ALLERGIC RHINITIS 07/27/2007    Consultants none  Reason for Admission: 7F with epigastric and RUQ pain 01/30/2022. One prior episode of pain last Wed around the time of her colonoscopy, which she reports was normal CS 23y ago, no other abdominal surgery  Procedures Lap chole, Dr. Derrell Lolling 9/11  Hospital Course:  The patient was admitted and underwent a laparoscopic cholecystectomy.  The patient tolerated the procedure well.  On POD 1, the patient was tolerating a regular diet, voiding well, mobilizing, and pain was controlled with oral pain medications.  The patient was stable for DC home at this time with appropriate follow up made.   Physical Exam: Abd: soft, appropriately tender, +BS, incisions c/d/o with ecchymosis around umbilical incision  Allergies as of 02/03/2022       Reactions   Escitalopram Oxalate    REACTION: nausea 10 mg   Sertraline Hcl    REACTION: sluggish        Medication List     TAKE these medications    acetaminophen 500 MG tablet Commonly known as: TYLENOL Take 2 tablets (1,000 mg total) by mouth every 6 (six) hours as needed.   Alka-Seltzer Heartburn  + Gas 750-80 MG Chew Generic drug: Calcium Carbonate-Simethicone Chew 1 Application by mouth daily as needed (for gas or heartburn).   cyanocobalamin 1000 MCG tablet Take 1,000 mcg by mouth daily.   glipiZIDE 2.5 MG 24 hr tablet Commonly known as: GLUCOTROL XL Take 1 tablet (2.5 mg total) by mouth daily with breakfast.   hydrOXYzine 10 MG tablet Commonly known as: ATARAX Take 1 tablet (10 mg total) by mouth 3 (three) times daily as needed for anxiety.   oxyCODONE 5 MG immediate release tablet Commonly known as: Roxicodone Take 1 tablet (5 mg total) by mouth every 4 (four) hours as needed for severe pain.   vitamin C with rose hips 500 MG tablet Take 500 mg by mouth daily.   Vitamin D3 25 MCG (1000 UT) Caps Take 1 capsule by mouth daily.          Follow-up Information     [facility], [place]. Go on 02/24/2022.   Specialty: General Surgery Why: Your appointment is 02/24/22 at 9:45 am  Arrive early to check in, fill out paperwork, Bring photo ID and insurance information Contact information: [address] [place] [address] [phone]                Signed: Barnetta Chapel, [facility] 02/03/2022, 9:00 AM Please see Am[facility] for pager number during day hours 7:00am-4:30pm, 7-11:30am on Weekends

## 2022-02-20 ENCOUNTER — Telehealth: Payer: Self-pay | Admitting: Family Medicine

## 2022-02-20 DIAGNOSIS — E1169 Type 2 diabetes mellitus with other specified complication: Secondary | ICD-10-CM

## 2022-02-20 NOTE — Telephone Encounter (Signed)
-----   Message from Ellamae Sia sent at 02/18/2022  4:08 PM EDT ----- Regarding: Lab orders for Monday, 10.9.23 Lab orders for a 6 month follow up appt

## 2022-03-02 ENCOUNTER — Other Ambulatory Visit (INDEPENDENT_AMBULATORY_CARE_PROVIDER_SITE_OTHER): Payer: Commercial Managed Care - HMO

## 2022-03-02 DIAGNOSIS — E1169 Type 2 diabetes mellitus with other specified complication: Secondary | ICD-10-CM

## 2022-03-02 LAB — COMPREHENSIVE METABOLIC PANEL
ALT: 13 U/L (ref 0–35)
AST: 14 U/L (ref 0–37)
Albumin: 4.2 g/dL (ref 3.5–5.2)
Alkaline Phosphatase: 55 U/L (ref 39–117)
BUN: 11 mg/dL (ref 6–23)
CO2: 29 mEq/L (ref 19–32)
Calcium: 9.1 mg/dL (ref 8.4–10.5)
Chloride: 103 mEq/L (ref 96–112)
Creatinine, Ser: 0.72 mg/dL (ref 0.40–1.20)
GFR: 89.68 mL/min (ref >60.00)
Glucose, Bld: 162 mg/dL — ABNORMAL HIGH (ref 70–99)
Potassium: 3.7 mEq/L (ref 3.5–5.1)
Sodium: 139 mEq/L (ref 135–145)
Total Bilirubin: 0.5 mg/dL (ref 0.2–1.2)
Total Protein: 7 g/dL (ref 6.0–8.3)

## 2022-03-02 LAB — LIPID PANEL
Cholesterol: 176 mg/dL (ref 0–200)
HDL: 68.8 mg/dL (ref >39.00)
LDL Cholesterol: 90 mg/dL (ref 0–99)
NonHDL: 106.73
Total CHOL/HDL Ratio: 3
Triglycerides: 83 mg/dL (ref 0.0–149.0)
VLDL: 16.6 mg/dL (ref 0.0–40.0)

## 2022-03-02 LAB — HEMOGLOBIN A1C: Hgb A1c MFr Bld: 7.1 % — ABNORMAL HIGH (ref 4.6–6.5)

## 2022-03-03 NOTE — Progress Notes (Signed)
No critical labs need to be addressed urgently. We will discuss labs in detail at upcoming office visit.   

## 2022-03-06 ENCOUNTER — Ambulatory Visit (INDEPENDENT_AMBULATORY_CARE_PROVIDER_SITE_OTHER): Payer: Commercial Managed Care - HMO | Admitting: Family Medicine

## 2022-03-06 VITALS — BP 100/62 | HR 85 | Temp 97.1°F | Ht 59.5 in | Wt 130.0 lb

## 2022-03-06 DIAGNOSIS — E1169 Type 2 diabetes mellitus with other specified complication: Secondary | ICD-10-CM | POA: Diagnosis not present

## 2022-03-06 DIAGNOSIS — E785 Hyperlipidemia, unspecified: Secondary | ICD-10-CM

## 2022-03-06 DIAGNOSIS — L239 Allergic contact dermatitis, unspecified cause: Secondary | ICD-10-CM | POA: Diagnosis not present

## 2022-03-06 DIAGNOSIS — T819XXA Unspecified complication of procedure, initial encounter: Secondary | ICD-10-CM

## 2022-03-06 DIAGNOSIS — Z23 Encounter for immunization: Secondary | ICD-10-CM

## 2022-03-06 MED ORDER — PREDNISONE 20 MG PO TABS: ORAL_TABLET | ORAL | 0 refills | Status: DC

## 2022-03-06 NOTE — Assessment & Plan Note (Addendum)
Chronic, worsening control with recent inactivity and diet change associated with recent cholecystectomy. She will get back on track with healthy eating and regular exercise.  She will return in 3 months for repeat evaluation with point-of-care A1c.  Of note with prednisone started for allergic dermatitis I will have her increase her glipizide back to 5 mg XL daily.  If her sugar is improving control she can return to her previous 2.5 mg XL given history of hypoglycemia.

## 2022-03-06 NOTE — Progress Notes (Signed)
Patient ID: Natalie Hudson, female    DOB: 1961/01/11, 61 y.o.   MRN: [id number]  This visit was conducted in person.  BP 100/62   Pulse 85   Temp (!) 97.1 F (36.2 C) (Temporal)   Ht 4' 11.5" (1.511 m)   Wt 130 lb (59 kg)   SpO2 100%   BMI 25.82 kg/m    CC:  Chief Complaint  Patient presents with   Follow-up    34mo- DM    Rash    Around gall bladder surgical sites and wrapped around abdomen    Subjective:   HPI: Natalie Hudson is a 61 y.o. female presenting on 03/06/2022 for Follow-up (57mo- DM ) and Rash (Around gall bladder surgical sites and wrapped around abdomen)   Had laparoscopic gallbladder surgery on 02/02/22 for cholecystitis.  ( Dr. Judie Bonus)  New onset rash  1 week after surgery... surgeon told her  to try topical hydrocortisone. Now rash has  spread to the remainder of abdomen. Surgeon's office sent in triamcinolone 2 times daily  in last 3 days... has not helped.  Has noted clear liquid from lower surgical site.   Improving abdominal pain. Very itchy overall.  No fever.  No other new exposures. No new medicaitons.. not taking pain meds.. just using ibuprofen and tylenol.  Reviewed post op visit from 02/24/22  Diabetes: Slight worsening of diabetes control on glipizide 2.5 mg XL p.o. daily (she was having some lows on 5 mg, so decreased dose)  She has not been eating right with recent gallbladder issues. Lab Results  Component Value Date   HGBA1C 7.1 (H) 03/02/2022  Using medications without difficulties: Hypoglycemic episodes:  no lows. Hyperglycemic episodes: none Feet problems: no ulcers Blood Sugars averaging: fbs 130-160 eye exam within last year:     BP Readings from Last 3 Encounters:  03/06/22 100/62  02/03/22 (!) 102/54  01/27/22 118/66    Elevated Cholesterol: LDL at goal less than 100, she is not currently on a statin medication despite the indication.  This was discussed in detail at today's office visit.  In past tried  atorvastatin.Marland Kitchen does not remember why she stopped.... does not want to start today. Lab Results  Component Value Date   CHOL 176 03/02/2022   HDL 68.80 03/02/2022   LDLCALC 90 03/02/2022   TRIG 83.0 03/02/2022   CHOLHDL 3 03/02/2022  Using medications without problems: Muscle aches:  Diet compliance: moderate see above Exercise: none Other complaints:   Relevant past medical, surgical, family and social history reviewed and updated as indicated. Interim medical history since our last visit reviewed. Allergies and medications reviewed and updated. Outpatient Medications Prior to Visit  Medication Sig Dispense Refill   acetaminophen (TYLENOL) 500 MG tablet Take 2 tablets (1,000 mg total) by mouth every 6 (six) hours as needed. 30 tablet 0   Ascorbic Acid (VITAMIN C WITH ROSE HIPS) 500 MG tablet Take 500 mg by mouth daily.     Calcium Carbonate-Simethicone (ALKA-SELTZER HEARTBURN + GAS) 750-80 MG CHEW Chew 1 Application by mouth daily as needed (for gas or heartburn).     Cholecalciferol (VITAMIN D3) 25 MCG (1000 UT) CAPS Take 1 capsule by mouth daily.     cyanocobalamin 1000 MCG tablet Take 1,000 mcg by mouth daily.     glipiZIDE (GLUCOTROL XL) 2.5 MG 24 hr tablet Take 1 tablet (2.5 mg total) by mouth daily with breakfast. 30 tablet 11   hydrOXYzine (ATARAX) 10 MG tablet Take  1 tablet (10 mg total) by mouth 3 (three) times daily as needed for anxiety. 30 tablet 0   triamcinolone cream (KENALOG) 0.1 % Apply topically 2 (two) times daily as needed.     oxyCODONE (ROXICODONE) 5 MG immediate release tablet Take 1 tablet (5 mg total) by mouth every 4 (four) hours as needed for severe pain. 30 tablet 0   No facility-administered medications prior to visit.     Per HPI unless specifically indicated in ROS section below Review of Systems  Constitutional:  Negative for fatigue and fever.  HENT:  Negative for congestion.   Eyes:  Negative for pain.  Respiratory:  Negative for cough and  shortness of breath.   Cardiovascular:  Negative for chest pain, palpitations and leg swelling.  Gastrointestinal:  Negative for abdominal pain.  Genitourinary:  Negative for dysuria and vaginal bleeding.  Musculoskeletal:  Negative for back pain.  Skin:  Positive for rash.  Neurological:  Negative for syncope, light-headedness and headaches.  Psychiatric/Behavioral:  Negative for dysphoric mood.    Objective:  BP 100/62   Pulse 85   Temp (!) 97.1 F (36.2 C) (Temporal)   Ht 4' 11.5" (1.511 m)   Wt 130 lb (59 kg)   SpO2 100%   BMI 25.82 kg/m   Wt Readings from Last 3 Encounters:  03/06/22 130 lb (59 kg)  02/02/22 135 lb (61.2 kg)  01/27/22 135 lb (61.2 kg)      Physical Exam Constitutional:      General: She is not in acute distress.    Appearance: Normal appearance. She is well-developed. She is not ill-appearing or toxic-appearing.  HENT:     Head: Normocephalic.     Right Ear: Hearing, tympanic membrane, ear canal and external ear normal. Tympanic membrane is not erythematous, retracted or bulging.     Left Ear: Hearing, tympanic membrane, ear canal and external ear normal. Tympanic membrane is not erythematous, retracted or bulging.     Nose: No mucosal edema or rhinorrhea.     Right Sinus: No maxillary sinus tenderness or frontal sinus tenderness.     Left Sinus: No maxillary sinus tenderness or frontal sinus tenderness.     Mouth/Throat:     Pharynx: Uvula midline.  Eyes:     General: Lids are normal. Lids are everted, no foreign bodies appreciated.     Conjunctiva/sclera: Conjunctivae normal.     Pupils: Pupils are equal, round, and reactive to light.  Neck:     Thyroid: No thyroid mass or thyromegaly.     Vascular: No carotid bruit.     Trachea: Trachea normal.  Cardiovascular:     Rate and Rhythm: Normal rate and regular rhythm.     Pulses: Normal pulses.     Heart sounds: Normal heart sounds, S1 normal and S2 normal. No murmur heard.    No friction rub. No  gallop.  Pulmonary:     Effort: Pulmonary effort is normal. No tachypnea or respiratory distress.     Breath sounds: Normal breath sounds. No decreased breath sounds, wheezing, rhonchi or rales.  Abdominal:     General: Bowel sounds are normal.     Palpations: Abdomen is soft.     Tenderness: There is no abdominal tenderness.  Musculoskeletal:     Cervical back: Normal range of motion and neck supple.  Skin:    General: Skin is warm and dry.     Findings: No rash.  Neurological:     Mental Status: She  is alert.  Psychiatric:        Mood and Affect: Mood is not anxious or depressed.        Speech: Speech normal.        Behavior: Behavior normal. Behavior is cooperative.        Thought Content: Thought content normal.        Judgment: Judgment normal.            Results for orders placed or performed in visit on 03/02/22  Comprehensive metabolic panel  Result Value Ref Range   Sodium 139 135 - 145 mEq/L   Potassium 3.7 3.5 - 5.1 mEq/L   Chloride 103 96 - 112 mEq/L   CO2 29 19 - 32 mEq/L   Glucose, Bld 162 (H) 70 - 99 mg/dL   BUN 11 6 - 23 mg/dL   Creatinine, Ser 1.24 0.40 - 1.20 mg/dL   Total Bilirubin 0.5 0.2 - 1.2 mg/dL   Alkaline Phosphatase 55 39 - 117 U/L   AST 14 0 - 37 U/L   ALT 13 0 - 35 U/L   Total Protein 7.0 6.0 - 8.3 g/dL   Albumin 4.2 3.5 - 5.2 g/dL   GFR 58.09 >98.33 mL/min   Calcium 9.1 8.4 - 10.5 mg/dL  Lipid panel  Result Value Ref Range   Cholesterol 176 0 - 200 mg/dL   Triglycerides 82.5 0.0 - 149.0 mg/dL   HDL 05.39 >76.73 mg/dL   VLDL 41.9 0.0 - 37.9 mg/dL   LDL Cholesterol 90 0 - 99 mg/dL   Total CHOL/HDL Ratio 3    NonHDL 106.73   Hemoglobin A1c  Result Value Ref Range   Hgb A1c MFr Bld 7.1 (H) 4.6 - 6.5 %     COVID 19 screen:  No recent travel or known exposure to COVID19 The patient denies respiratory symptoms of COVID 19 at this time. The importance of social distancing was discussed today.   Assessment and Plan    Problem  List Items Addressed This Visit     Allergic dermatitis   Hyperlipidemia associated with type 2 diabetes mellitus (HCC)    LDL at goal less than 100 but she is not currently on a statin medication despite need given in setting of diabetes.  She is not interested in starting this despite increased cardiovascular risk.      Surgical site reaction    Acute, appearance is most consistent with reaction to something in regards to surgical prep or surgery itself.  There is no clear definite sign of infection such as fever, increasing abdominal pain or purulent discharge. She has had minimal improvement with topical triamcinolone cream twice daily in the last 2 to 3 days.  I have encouraged her to start prednisone taper to treat possible allergic dermatitis.  She will contact surgeon for follow-up if symptoms or not improving. She will be seen over the weekend at urgent care or ER if she has new onset fever or redness is locally spreading.      Type 2 diabetes mellitus with other specified complication (HCC) - Primary    Chronic, worsening control with recent inactivity and diet change associated with recent cholecystectomy. She will get back on track with healthy eating and regular exercise.  She will return in 3 months for repeat evaluation with point-of-care A1c.  Of note with prednisone started for allergic dermatitis I will have her increase her glipizide back to 5 mg XL daily.  If her sugar is improving control  she can return to her previous 2.5 mg XL given history of hypoglycemia.      Other Visit Diagnoses     Need for influenza vaccination       Relevant Orders   Flu Vaccine QUAD 22mo+IM (Fluarix, Fluzone & Alfiuria Quad PF)        Eliezer Lofts, MD

## 2022-03-06 NOTE — Assessment & Plan Note (Signed)
LDL at goal less than 100 but she is not currently on a statin medication despite need given in setting of diabetes.  She is not interested in starting this despite increased cardiovascular risk.

## 2022-03-06 NOTE — Patient Instructions (Addendum)
Get back  on track  as tolerated with regular exercise and low carb diet. Can return for flu shot clinic.  Complete course of prednisone for allergic dermatitis on abdomen.  While on prednisone , increase glipizide 2.5  to 5 mg  then can return.  Can continue topical triamcinolone BID.  If redness  spreading, pain increasing or not improving follow up with surgeon early next week.  If fever or pus discharge.Marland Kitchen go to ER.

## 2022-03-06 NOTE — Assessment & Plan Note (Signed)
Acute, appearance is most consistent with reaction to something in regards to surgical prep or surgery itself.  There is no clear definite sign of infection such as fever, increasing abdominal pain or purulent discharge. She has had minimal improvement with topical triamcinolone cream twice daily in the last 2 to 3 days.  I have encouraged her to start prednisone taper to treat possible allergic dermatitis.  She will contact surgeon for follow-up if symptoms or not improving. She will be seen over the weekend at urgent care or ER if she has new onset fever or redness is locally spreading.

## 2022-05-06 ENCOUNTER — Encounter: Payer: Commercial Managed Care - HMO | Admitting: Family Medicine

## 2022-05-26 ENCOUNTER — Telehealth: Payer: Self-pay | Admitting: Family Medicine

## 2022-05-26 DIAGNOSIS — E1169 Type 2 diabetes mellitus with other specified complication: Secondary | ICD-10-CM

## 2022-05-26 NOTE — Telephone Encounter (Signed)
-----   Message from Velna Hatchet, RT sent at 05/19/2022  2:31 PM EST ----- Regarding: [name] is scheduled for cpx, please order future labs.  Thanks, Anda Kraft

## 2022-06-02 ENCOUNTER — Other Ambulatory Visit: Payer: Commercial Managed Care - HMO

## 2022-06-09 ENCOUNTER — Ambulatory Visit (INDEPENDENT_AMBULATORY_CARE_PROVIDER_SITE_OTHER): Payer: 59 | Admitting: Family Medicine

## 2022-06-09 ENCOUNTER — Encounter: Payer: Self-pay | Admitting: Family Medicine

## 2022-06-09 VITALS — BP 108/70 | HR 84 | Temp 98.2°F | Resp 16 | Ht 59.5 in | Wt 130.4 lb

## 2022-06-09 DIAGNOSIS — Z0000 Encounter for general adult medical examination without abnormal findings: Secondary | ICD-10-CM | POA: Diagnosis not present

## 2022-06-09 DIAGNOSIS — R590 Localized enlarged lymph nodes: Secondary | ICD-10-CM | POA: Diagnosis not present

## 2022-06-09 DIAGNOSIS — E785 Hyperlipidemia, unspecified: Secondary | ICD-10-CM

## 2022-06-09 DIAGNOSIS — E1169 Type 2 diabetes mellitus with other specified complication: Secondary | ICD-10-CM | POA: Diagnosis not present

## 2022-06-09 LAB — POCT GLYCOSYLATED HEMOGLOBIN (HGB A1C): Hemoglobin A1C: 6.7 % — AB (ref 4.0–5.6)

## 2022-06-09 LAB — HM DIABETES FOOT EXAM

## 2022-06-09 MED ORDER — ATORVASTATIN CALCIUM 10 MG PO TABS: 10.0000 mg | ORAL_TABLET | ORAL | 11 refills | Status: DC

## 2022-06-09 NOTE — Assessment & Plan Note (Signed)
Chronic... has DM   Start Daily during that  note atorvastatin 10 mg daily.  Re-eval in 3 months.

## 2022-06-09 NOTE — Progress Notes (Signed)
Patient ID: [name], female    DOB: 1960/07/28, 62 y.o.   MRN: [id number]  This visit was conducted in person.   CC: Chief Complaint  Patient presents with   Annual Exam    Subjective:   HPI: Natalie Hudson is a 62 y.o. female presenting on 06/09/2022 for Annual Exam    Diabetes:  Lab Results  Component Value Date   HGBA1C 6.7 (A) 06/09/2022  Using medications without difficulties: Hypoglycemic episodes:none Hyperglycemic episodes:none Feet problems: no ulcers Blood Sugars averaging: CBGs  140-150 eye exam within last year:  due  Elevated Cholesterol:  LDL at goal  < 100 with DM but not on statin. She is now willing to start atorvastatin. Lab Results  Component Value Date   CHOL 176 03/02/2022   HDL 68.80 03/02/2022   LDLCALC 90 03/02/2022   TRIG 83.0 03/02/2022   CHOLHDL 3 03/02/2022  using medications without problems: Muscle aches:  Diet compliance: moderate.. poor during the holiday's Exercise: none Other complaints:    After questioning about lymph node in left neck... noted for 1 year, occ issue swallowing, may be larger, no pain.  No night sweats,  no unexpected weight loss.  Relevant past medical, surgical, family and social history reviewed and updated as indicated. Interim medical history since our last visit reviewed. Allergies and medications reviewed and updated. Outpatient Medications Prior to Visit  Medication Sig Dispense Refill   acetaminophen (TYLENOL) 500 MG tablet Take 2 tablets (1,000 mg total) by mouth every 6 (six) hours as needed. 30 tablet 0   Ascorbic Acid (VITAMIN C WITH ROSE HIPS) 500 MG tablet Take 500 mg by mouth daily.     Biotin 5000 MCG CAPS Take 5,000 mcg by mouth daily.     Calcium Carbonate-Simethicone (ALKA-SELTZER HEARTBURN + GAS) 750-80 MG CHEW Chew 1 Application by mouth daily as needed (for gas or heartburn).     Cholecalciferol (VITAMIN D3) 25 MCG (1000 UT) CAPS Take 1 capsule by mouth daily.      cyanocobalamin 1000 MCG tablet Take 1,000 mcg by mouth daily.     glipiZIDE (GLUCOTROL XL) 2.5 MG 24 hr tablet Take 1 tablet (2.5 mg total) by mouth daily with breakfast. 30 tablet 11   hydrOXYzine (ATARAX) 10 MG tablet Take 1 tablet (10 mg total) by mouth 3 (three) times daily as needed for anxiety. 30 tablet 0   triamcinolone cream (KENALOG) 0.1 % Apply topically 2 (two) times daily as needed. (Patient not taking: Reported on 06/09/2022)     predniSONE (DELTASONE) 20 MG tablet 3 tabs by mouth daily x 3 days, then 2 tabs by mouth daily x 2 days then 1 tab by mouth daily x 2 days 15 tablet 0   No facility-administered medications prior to visit.     Per HPI unless specifically indicated in ROS section below Review of Systems  Constitutional:  Negative for fatigue and fever.  HENT:  Negative for congestion.   Eyes:  Negative for pain.  Respiratory:  Negative for cough and shortness of breath.   Cardiovascular:  Negative for chest pain, palpitations and leg swelling.  Gastrointestinal:  Negative for abdominal pain.  Genitourinary:  Negative for dysuria and vaginal bleeding.  Musculoskeletal:  Negative for back pain.  Neurological:  Negative for syncope, light-headedness and headaches.  Psychiatric/Behavioral:  Negative for dysphoric mood.    Objective:  BP 108/70   Pulse 84   Temp 98.2 F (36.8 C)   Resp 16  Ht 4' 11.5" (1.511 m)   Wt 130 lb 6 oz (59.1 kg)   SpO2 98%   BMI 25.89 kg/m   Wt Readings from Last 3 Encounters:  06/09/22 130 lb 6 oz (59.1 kg)  03/06/22 130 lb (59 kg)  02/02/22 135 lb (61.2 kg)      Physical Exam Constitutional:      General: She is not in acute distress.    Appearance: Normal appearance. She is well-developed. She is not ill-appearing or toxic-appearing.  HENT:     Head: Normocephalic.     Right Ear: Hearing, tympanic membrane, ear canal and external ear normal.     Left Ear: Hearing, tympanic membrane, ear canal and external ear normal.      Nose: Nose normal.  Eyes:     General: Lids are normal. Lids are everted, no foreign bodies appreciated.     Conjunctiva/sclera: Conjunctivae normal.     Pupils: Pupils are equal, round, and reactive to light.  Neck:     Thyroid: No thyroid mass or thyromegaly.     Vascular: No carotid bruit.     Trachea: Trachea normal.  Cardiovascular:     Rate and Rhythm: Normal rate and regular rhythm.     Heart sounds: Normal heart sounds, S1 normal and S2 normal. No murmur heard.    No gallop.  Pulmonary:     Effort: Pulmonary effort is normal. No respiratory distress.     Breath sounds: Normal breath sounds. No wheezing, rhonchi or rales.  Abdominal:     General: Bowel sounds are normal. There is no distension or abdominal bruit.     Palpations: Abdomen is soft. There is no fluid wave or mass.     Tenderness: There is no abdominal tenderness. There is no guarding or rebound.     Hernia: No hernia is present.  Genitourinary:    Exam position: Supine.     Labia:        Right: No rash, tenderness or lesion.        Left: No rash, tenderness or lesion.      Vagina: Normal.     Cervix: No cervical motion tenderness, discharge or friability.     Uterus: Not enlarged and not tender.      Adnexa:        Right: No mass, tenderness or fullness.         Left: No mass, tenderness or fullness.    Musculoskeletal:     Cervical back: Normal range of motion and neck supple.  Lymphadenopathy:     Cervical: Cervical adenopathy present.     Right cervical: No superficial, deep or posterior cervical adenopathy.    Left cervical: Superficial cervical adenopathy present. No deep or posterior cervical adenopathy.     Upper Body:     Right upper body: No supraclavicular, axillary, pectoral or epitrochlear adenopathy.     Left upper body: No supraclavicular, axillary, pectoral or epitrochlear adenopathy.  Skin:    General: Skin is warm and dry.     Findings: No rash.  Neurological:     Mental Status: She is  alert.     Cranial Nerves: No cranial nerve deficit.     Sensory: No sensory deficit.  Psychiatric:        Mood and Affect: Mood is not anxious or depressed.        Speech: Speech normal.        Behavior: Behavior normal. Behavior is cooperative.  Judgment: Judgment normal.    Diabetic foot exam: Normal inspection No skin breakdown No calluses  Normal DP pulses Normal sensation to light touch and monofilament Nails normal     Results for orders placed or performed in visit on 06/09/22  POCT glycosylated hemoglobin (Hb A1C)  Result Value Ref Range   Hemoglobin A1C 6.7 (A) 4.0 - 5.6 %   HbA1c POC (<> result, manual entry)     HbA1c, POC (prediabetic range)     HbA1c, POC (controlled diabetic range)    HM DIABETES FOOT EXAM  Result Value Ref Range   HM Diabetic Foot Exam done     This visit occurred during the SARS-CoV-2 public health emergency.  Safety protocols were in place, including screening questions prior to the visit, additional usage of staff PPE, and extensive cleaning of exam room while observing appropriate contact time as indicated for disinfecting solutions.   COVID 19 screen:  No recent travel or known exposure to COVID19 The patient denies respiratory symptoms of COVID 19 at this time. The importance of social distancing was discussed today.   Assessment and Plan The patient's preventative maintenance and recommended screening tests for an annual wellness exam were reviewed in full today. Brought up to date unless services declined.  Counselled on the importance of diet, exercise, and its role in overall health and mortality. The patient's FH and SH was reviewed, including their home life, tobacco status, and drug and alcohol status.   Vaccines: given flu  today and PNA 23 vaccine today. Uptodate with  Tdap.   Consider shingrix vaccine and  discussed COVID19 vaccine side effects and benefits. Strongly encouraged the patient to get the vaccine.  Questions answered. PAP/DVE: 2010, 2011.. 2353,6144 normal no HPV DVE yearly, Due q5 years Due Mammo: 12/2021 Colon: nml 01/2022 repeat in 10 years.    Nonsmoker Hep C negative. HIV: refused.    Problem List Items Addressed This Visit     Hyperlipidemia associated with type 2 diabetes mellitus (HCC)     Chronic... has DM   Start Daily during that  note atorvastatin 10 mg daily.  Re-eval in 3 months.      Relevant Medications   atorvastatin (LIPITOR) 10 MG tablet (Start on 06/10/2022)   Left cervical lymphadenopathy   Relevant Orders   US SOFT TISSUE HEAD & NECK (NON-THYROID)   Type 2 diabetes mellitus with other specified complication (HCC)    Recheck A1C today. Diet controlled..... improved A1c at 6.7 today!        Relevant Medications   atorvastatin (LIPITOR) 10 MG tablet (Start on 06/10/2022)   Other Relevant Orders   POCT glycosylated hemoglobin (Hb A1C) (Completed)   Other Visit Diagnoses     Routine general medical examination at a health care facility    -  Primary       Kerby Nora, MD

## 2022-06-09 NOTE — Patient Instructions (Addendum)
Start atorvastatin 10 mg three days a week.. call if any side effects. Set up yearly eye exam for diabetes and have the opthalmologist send [place] a copy of the evaluation for the chart. We will set up neck US.

## 2022-06-09 NOTE — Assessment & Plan Note (Addendum)
Recheck A1C today. Diet controlled..... improved A1c at 6.7 today!

## 2022-06-17 ENCOUNTER — Ambulatory Visit
Admission: RE | Admit: 2022-06-17 | Discharge: 2022-06-17 | Disposition: A | Discharge status: Home / Self Care | Payer: 59 | Source: Ambulatory Visit | Attending: Family Medicine | Admitting: Family Medicine

## 2022-06-17 DIAGNOSIS — R590 Localized enlarged lymph nodes: Secondary | ICD-10-CM | POA: Insufficient documentation

## 2022-10-21 ENCOUNTER — Other Ambulatory Visit: Payer: Self-pay | Admitting: Family Medicine

## 2022-10-21 NOTE — Telephone Encounter (Signed)
Patient scheduled.

## 2022-10-21 NOTE — Telephone Encounter (Signed)
Please schedule Diabetes follow up with fasting labs prior.  Per last AVS:   Return in about 3 months (around 09/08/2022) for diabetes follow up with fasting labs prior.

## 2022-10-27 ENCOUNTER — Telehealth: Payer: Self-pay | Admitting: Family Medicine

## 2022-10-27 DIAGNOSIS — E1169 Type 2 diabetes mellitus with other specified complication: Secondary | ICD-10-CM

## 2022-10-27 NOTE — Telephone Encounter (Signed)
-----   Message from Alvina Chou sent at 10/26/2022 12:49 PM EDT ----- Regarding: Lab orders for Wednesday, 6.5.24 Lab orders, thanks

## 2022-10-28 ENCOUNTER — Other Ambulatory Visit (INDEPENDENT_AMBULATORY_CARE_PROVIDER_SITE_OTHER): Payer: 59

## 2022-10-28 DIAGNOSIS — E1169 Type 2 diabetes mellitus with other specified complication: Secondary | ICD-10-CM

## 2022-10-28 DIAGNOSIS — Z7984 Long term (current) use of oral hypoglycemic drugs: Secondary | ICD-10-CM

## 2022-10-28 LAB — COMPREHENSIVE METABOLIC PANEL
ALT: 11 U/L (ref 0–35)
AST: 14 U/L (ref 0–37)
Albumin: 4.1 g/dL (ref 3.5–5.2)
Alkaline Phosphatase: 56 U/L (ref 39–117)
BUN: 15 mg/dL (ref 6–23)
CO2: 25 mEq/L (ref 19–32)
Calcium: 9.2 mg/dL (ref 8.4–10.5)
Chloride: 104 mEq/L (ref 96–112)
Creatinine, Ser: 0.72 mg/dL (ref 0.40–1.20)
GFR: 89.27 mL/min (ref >60.00)
Glucose, Bld: 141 mg/dL — ABNORMAL HIGH (ref 70–99)
Potassium: 4.1 mEq/L (ref 3.5–5.1)
Sodium: 141 mEq/L (ref 135–145)
Total Bilirubin: 0.5 mg/dL (ref 0.2–1.2)
Total Protein: 6.8 g/dL (ref 6.0–8.3)

## 2022-10-28 LAB — MICROALBUMIN / CREATININE URINE RATIO
Creatinine,U: 86.1 mg/dL
Microalb Creat Ratio: 0.8 mg/g (ref 0.0–30.0)
Microalb, Ur: <0.7 mg/dL (ref 0.0–1.9)

## 2022-10-28 LAB — LIPID PANEL
Cholesterol: 163 mg/dL (ref 0–200)
HDL: 64.8 mg/dL (ref >39.00)
LDL Cholesterol: 78 mg/dL (ref 0–99)
NonHDL: 97.77
Total CHOL/HDL Ratio: 3
Triglycerides: 100 mg/dL (ref 0.0–149.0)
VLDL: 20 mg/dL (ref 0.0–40.0)

## 2022-10-28 LAB — HEMOGLOBIN A1C: Hgb A1c MFr Bld: 7 % — ABNORMAL HIGH (ref 4.6–6.5)

## 2022-10-29 NOTE — Progress Notes (Signed)
No critical labs need to be addressed urgently. We will discuss labs in detail at upcoming office visit.   

## 2022-11-03 ENCOUNTER — Encounter: Payer: Self-pay | Admitting: Family Medicine

## 2022-11-03 ENCOUNTER — Ambulatory Visit (INDEPENDENT_AMBULATORY_CARE_PROVIDER_SITE_OTHER): Payer: 59 | Admitting: Family Medicine

## 2022-11-03 VITALS — BP 112/74 | HR 79 | Temp 98.4°F | Ht 59.5 in | Wt 131.0 lb

## 2022-11-03 DIAGNOSIS — E1169 Type 2 diabetes mellitus with other specified complication: Secondary | ICD-10-CM

## 2022-11-03 DIAGNOSIS — E785 Hyperlipidemia, unspecified: Secondary | ICD-10-CM | POA: Diagnosis not present

## 2022-11-03 DIAGNOSIS — T466X5A Adverse effect of antihyperlipidemic and antiarteriosclerotic drugs, initial encounter: Secondary | ICD-10-CM | POA: Diagnosis not present

## 2022-11-03 DIAGNOSIS — M791 Myalgia, unspecified site: Secondary | ICD-10-CM | POA: Diagnosis not present

## 2022-11-03 NOTE — Progress Notes (Signed)
Patient ID: Natalie Hudson, female    DOB: May 30, 1960, 62 y.o.   MRN: [id number]  This visit was conducted in person.  BP 112/74 (BP Location: Left Arm, Patient Position: Sitting, Cuff Size: Normal)   Pulse 79   Temp 98.4 F (36.9 C) (Temporal)   Ht 4' 11.5" (1.511 m)   Wt 131 lb (59.4 kg)   SpO2 98%   BMI 26.02 kg/m    CC:  Chief Complaint  Patient presents with   Diabetes    Subjective:   HPI: Natalie Hudson is a 62 y.o. female presenting on 11/03/2022 for Diabetes  Diabetes: Slight worsening of diabetes control on glipizide 2.5 mg XL p.o. daily (she was having some lows on 5 mg, so decreased dose)  She has not been eating right with recent gallbladder issues, she states she has good and bad days with diet.  She is walking 3-4 days a week. Lab Results  Component Value Date   HGBA1C 7.0 (H) 10/28/2022  Using medications without difficulties: Hypoglycemic episodes:  no lows. Hyperglycemic episodes: none Feet problems: no ulcers Blood Sugars averaging: fbs 130-160 eye exam within last year: due      BP Readings from Last 3 Encounters:  11/03/22 112/74  06/09/22 108/70  03/06/22 100/62    Elevated Cholesterol: LDL at goal less than 100.. was on atorvastatin 10 mg daily.. was not able to tolerate given body ache. Lab Results  Component Value Date   CHOL 163 10/28/2022   HDL 64.80 10/28/2022   LDLCALC 78 10/28/2022   TRIG 100.0 10/28/2022   CHOLHDL 3 10/28/2022  Using medications without problems: Muscle aches:  Diet compliance: moderate see above Exercise: walk 3-4 days a week. Other complaints:   Relevant past medical, surgical, family and social history reviewed and updated as indicated. Interim medical history since our last visit reviewed. Allergies and medications reviewed and updated. Outpatient Medications Prior to Visit  Medication Sig Dispense Refill   acetaminophen (TYLENOL) 500 MG tablet Take 2 tablets (1,000 mg total) by mouth every 6 (six)  hours as needed. 30 tablet 0   Ascorbic Acid (VITAMIN C WITH ROSE HIPS) 500 MG tablet Take 500 mg by mouth daily.     Biotin 5000 MCG CAPS Take 5,000 mcg by mouth daily.     Calcium Carbonate-Simethicone (ALKA-SELTZER HEARTBURN + GAS) 750-80 MG CHEW Chew 1 Application by mouth daily as needed (for gas or heartburn).     Cholecalciferol (VITAMIN D3) 25 MCG (1000 UT) CAPS Take 1 capsule by mouth daily.     cyanocobalamin 1000 MCG tablet Take 1,000 mcg by mouth daily.     glipiZIDE (GLUCOTROL XL) 2.5 MG 24 hr tablet TAKE ONE TABLET BY MOUTH ONCE A DAY WITH BREAKFAST 30 tablet 0   hydrOXYzine (ATARAX) 10 MG tablet Take 1 tablet (10 mg total) by mouth 3 (three) times daily as needed for anxiety. 30 tablet 0   triamcinolone cream (KENALOG) 0.1 % Apply topically 2 (two) times daily as needed.     atorvastatin (LIPITOR) 10 MG tablet Take 1 tablet (10 mg total) by mouth every Monday, Wednesday, and Friday. (Patient not taking: Reported on 11/03/2022) 12 tablet 11   No facility-administered medications prior to visit.     Per HPI unless specifically indicated in ROS section below Review of Systems  Constitutional:  Negative for fatigue and fever.  HENT:  Negative for congestion.   Eyes:  Negative for pain.  Respiratory:  Negative for cough  and shortness of breath.   Cardiovascular:  Negative for chest pain, palpitations and leg swelling.  Gastrointestinal:  Negative for abdominal pain.  Genitourinary:  Negative for dysuria and vaginal bleeding.  Musculoskeletal:  Negative for back pain.  Skin:  Positive for rash.  Neurological:  Negative for syncope, light-headedness and headaches.  Psychiatric/Behavioral:  Negative for dysphoric mood.    Objective:  BP 112/74 (BP Location: Left Arm, Patient Position: Sitting, Cuff Size: Normal)   Pulse 79   Temp 98.4 F (36.9 C) (Temporal)   Ht 4' 11.5" (1.511 m)   Wt 131 lb (59.4 kg)   SpO2 98%   BMI 26.02 kg/m   Wt Readings from Last 3 Encounters:   11/03/22 131 lb (59.4 kg)  06/09/22 130 lb 6 oz (59.1 kg)  03/06/22 130 lb (59 kg)      Physical Exam Constitutional:      General: She is not in acute distress.    Appearance: Normal appearance. She is well-developed. She is not ill-appearing or toxic-appearing.  HENT:     Head: Normocephalic.     Right Ear: Hearing, tympanic membrane, ear canal and external ear normal. Tympanic membrane is not erythematous, retracted or bulging.     Left Ear: Hearing, tympanic membrane, ear canal and external ear normal. Tympanic membrane is not erythematous, retracted or bulging.     Nose: No mucosal edema or rhinorrhea.     Right Sinus: No maxillary sinus tenderness or frontal sinus tenderness.     Left Sinus: No maxillary sinus tenderness or frontal sinus tenderness.     Mouth/Throat:     Pharynx: Uvula midline.  Eyes:     General: Lids are normal. Lids are everted, no foreign bodies appreciated.     Conjunctiva/sclera: Conjunctivae normal.     Pupils: Pupils are equal, round, and reactive to light.  Neck:     Thyroid: No thyroid mass or thyromegaly.     Vascular: No carotid bruit.     Trachea: Trachea normal.  Cardiovascular:     Rate and Rhythm: Normal rate and regular rhythm.     Pulses: Normal pulses.     Heart sounds: Normal heart sounds, S1 normal and S2 normal. No murmur heard.    No friction rub. No gallop.  Pulmonary:     Effort: Pulmonary effort is normal. No tachypnea or respiratory distress.     Breath sounds: Normal breath sounds. No decreased breath sounds, wheezing, rhonchi or rales.  Abdominal:     General: Bowel sounds are normal.     Palpations: Abdomen is soft.     Tenderness: There is no abdominal tenderness.  Musculoskeletal:     Cervical back: Normal range of motion and neck supple.  Skin:    General: Skin is warm and dry.     Findings: No rash.  Neurological:     Mental Status: She is alert.  Psychiatric:        Mood and Affect: Mood is not anxious or  depressed.        Speech: Speech normal.        Behavior: Behavior normal. Behavior is cooperative.        Thought Content: Thought content normal.        Judgment: Judgment normal.     Results for orders placed or performed in visit on 10/28/22  Microalbumin / creatinine urine ratio  Result Value Ref Range   Microalb, Ur <0.7 0.0 - 1.9 mg/dL   Creatinine,U 16.1 mg/dL  Microalb Creat Ratio 0.8 0.0 - 30.0 mg/g  Comprehensive metabolic panel  Result Value Ref Range   Sodium 141 135 - 145 mEq/L   Potassium 4.1 3.5 - 5.1 mEq/L   Chloride 104 96 - 112 mEq/L   CO2 25 19 - 32 mEq/L   Glucose, Bld 141 (H) 70 - 99 mg/dL   BUN 15 6 - 23 mg/dL   Creatinine, Ser 6.29 0.40 - 1.20 mg/dL   Total Bilirubin 0.5 0.2 - 1.2 mg/dL   Alkaline Phosphatase 56 39 - 117 U/L   AST 14 0 - 37 U/L   ALT 11 0 - 35 U/L   Total Protein 6.8 6.0 - 8.3 g/dL   Albumin 4.1 3.5 - 5.2 g/dL   GFR 52.84 >13.24 mL/min   Calcium 9.2 8.4 - 10.5 mg/dL  Lipid panel  Result Value Ref Range   Cholesterol 163 0 - 200 mg/dL   Triglycerides 401.0 0.0 - 149.0 mg/dL   HDL 27.25 >36.64 mg/dL   VLDL 40.3 0.0 - 47.4 mg/dL   LDL Cholesterol 78 0 - 99 mg/dL   Total CHOL/HDL Ratio 3    NonHDL 97.77   Hemoglobin A1c  Result Value Ref Range   Hgb A1c MFr Bld 7.0 (H) 4.6 - 6.5 %     COVID 19 screen:  No recent travel or known exposure to COVID19 The patient denies respiratory symptoms of COVID 19 at this time. The importance of social distancing was discussed today.   Assessment and Plan    Problem List Items Addressed This Visit     Hyperlipidemia associated with type 2 diabetes mellitus (HCC)     Chronic, well-controlled at goal LDL  less than 100.  Was on atorvastatin 10 mg daily unable to tolerated even at low dose.. caused myalgia.  Not interested in trial of CoQ 10 for SE.      Myalgia due to statin     Intolerant of statin med.. causes severe muscle ache and pain.       Type 2 diabetes mellitus with  other specified complication (HCC) - Primary    Stable, chronic.  Continue current medication.         Request to Add on to [facility] eye visit made.  Kerby Nora, MD

## 2022-11-03 NOTE — Assessment & Plan Note (Signed)
Intolerant of statin med.. causes severe muscle ache and pain.

## 2022-11-03 NOTE — Assessment & Plan Note (Signed)
Stable, chronic.  Continue current medication.    

## 2022-11-03 NOTE — Assessment & Plan Note (Addendum)
Chronic, well-controlled at goal LDL  less than 100.  Was on atorvastatin 10 mg daily unable to tolerated even at low dose.. caused myalgia.  Not interested in trial of CoQ 10 for SE.

## 2022-11-03 NOTE — Patient Instructions (Addendum)
Set up yearly eye exam for diabetes and have the opthalmologist send [place] a copy of the evaluation for the chart.   Work on low [employer].

## 2022-11-16 ENCOUNTER — Other Ambulatory Visit: Payer: Self-pay | Admitting: Family Medicine

## 2022-12-29 LAB — HM DIABETES EYE EXAM

## 2023-01-07 ENCOUNTER — Encounter (INDEPENDENT_AMBULATORY_CARE_PROVIDER_SITE_OTHER): Payer: Self-pay

## 2023-01-25 IMAGING — MG MM DIGITAL SCREENING BILAT W/ TOMO AND CAD
8 series · 9 of 24 positions shown · non-contrast
Comparison: Previous exam(s).

CLINICAL DATA: Screening.

EXAM:
DIGITAL SCREENING BILATERAL MAMMOGRAM WITH TOMOSYNTHESIS AND CAD
TECHNIQUE: Bilateral screening digital craniocaudal and mediolateral oblique
mammograms were obtained. Bilateral screening digital breast
tomosynthesis was performed. The images were evaluated with
computer-aided detection.

[L CC synth-2D]
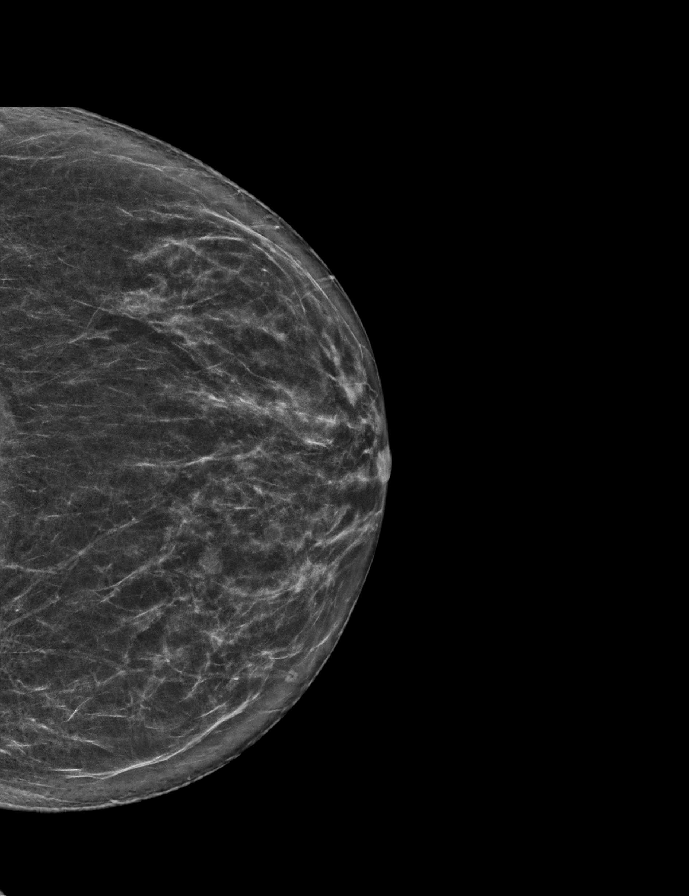

[R CC synth-2D]
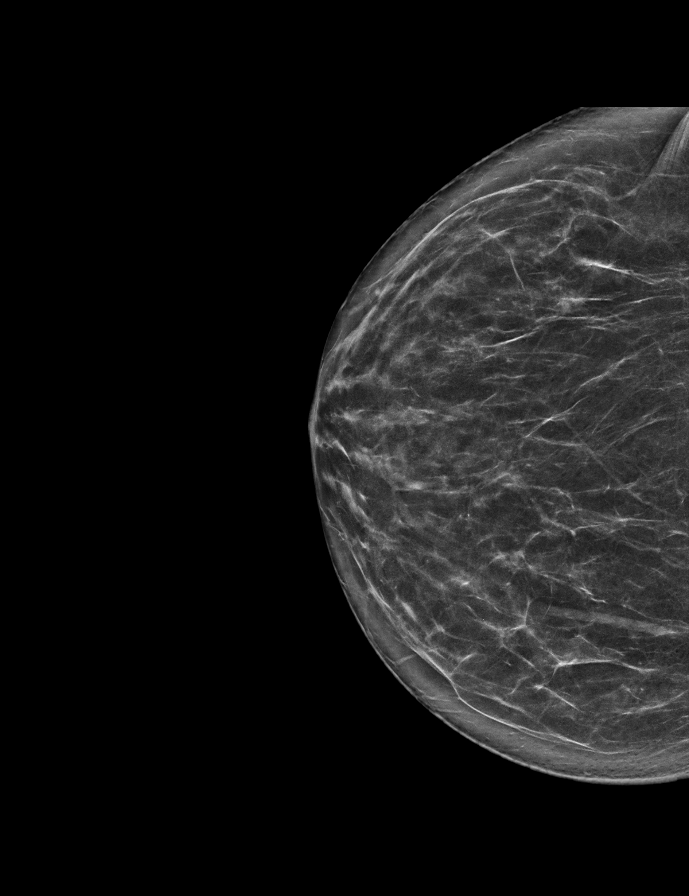

[L MLO synth-2D]
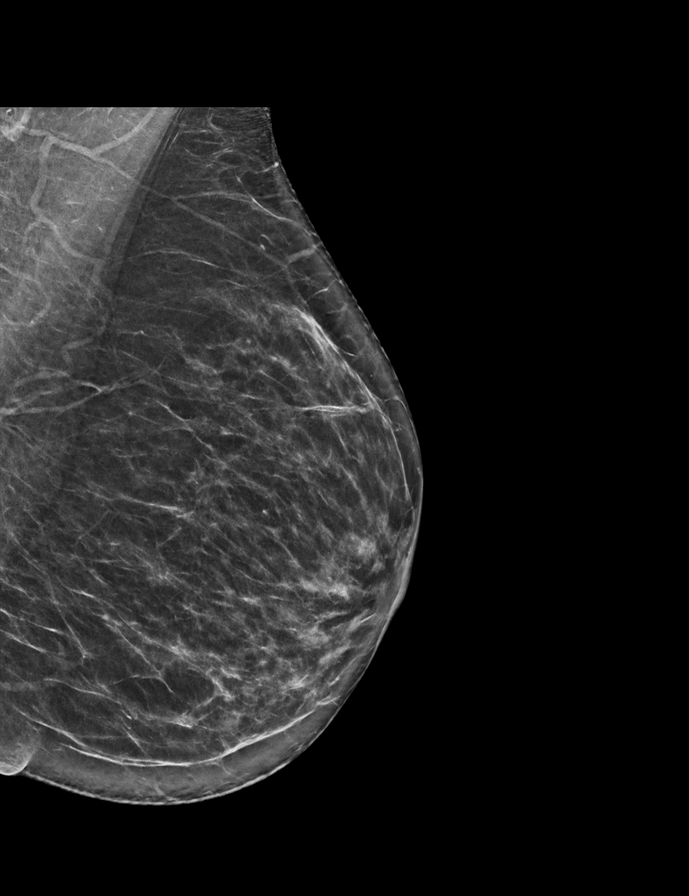

[R MLO synth-2D]
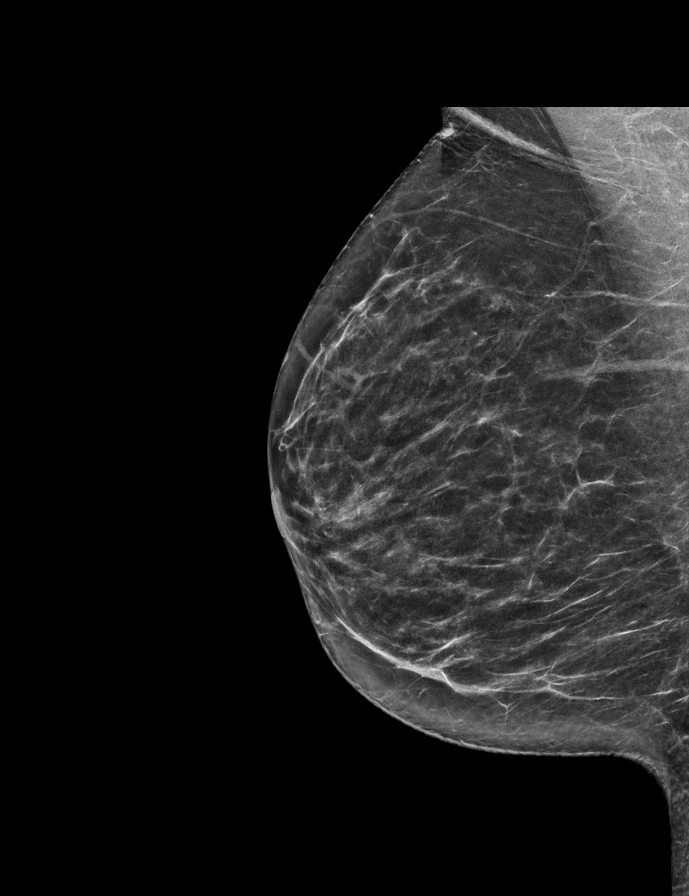

[R MLO tomo · 2 of 62 frames shown]
[frame 21/62]
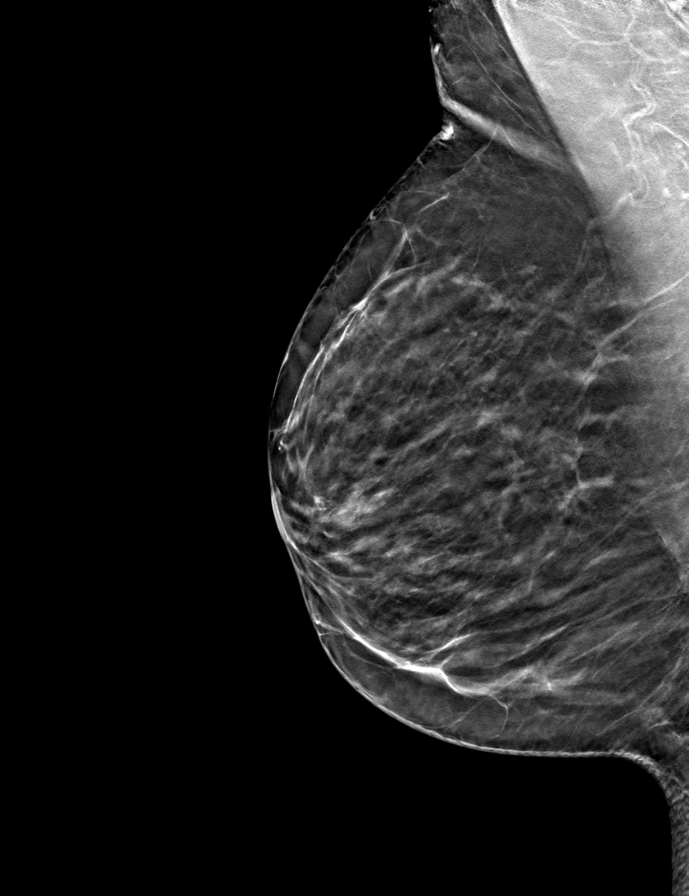
[frame 31/62]
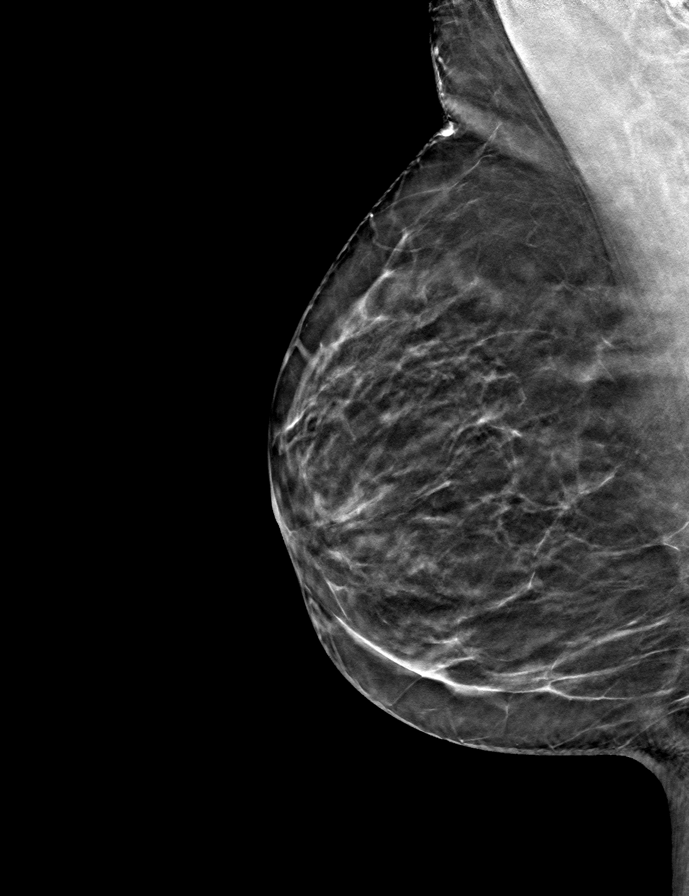

[L MLO tomo · tomo slice 33/66.0]
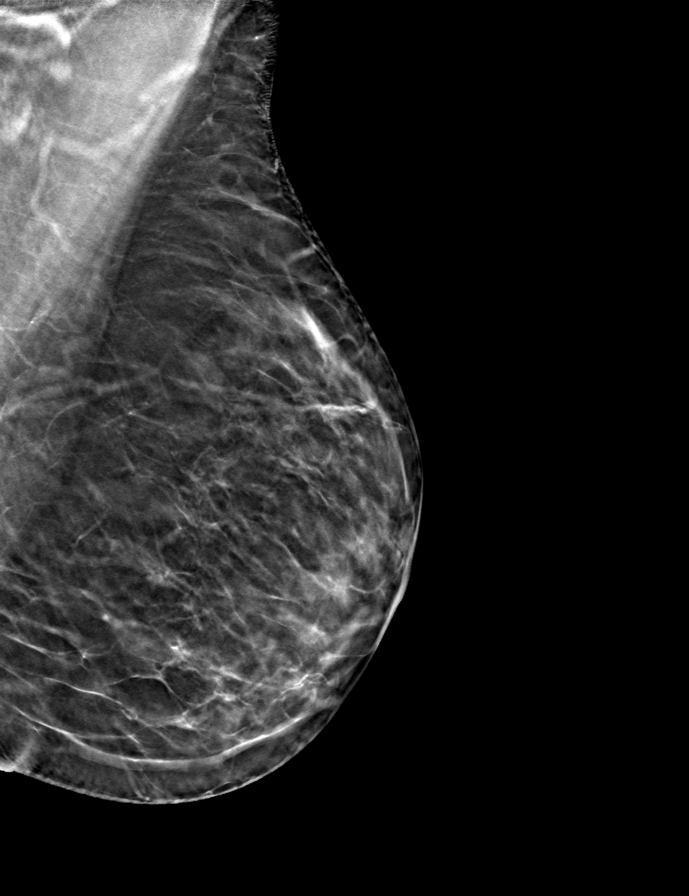

[R CC tomo · tomo slice 34/67.0]
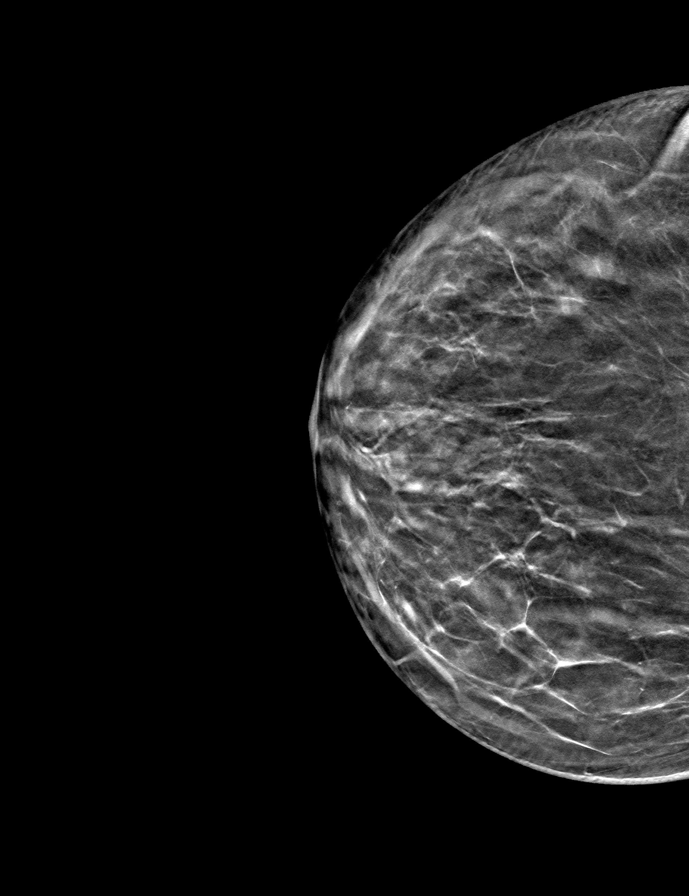

[L CC tomo · tomo slice 31/60.0]
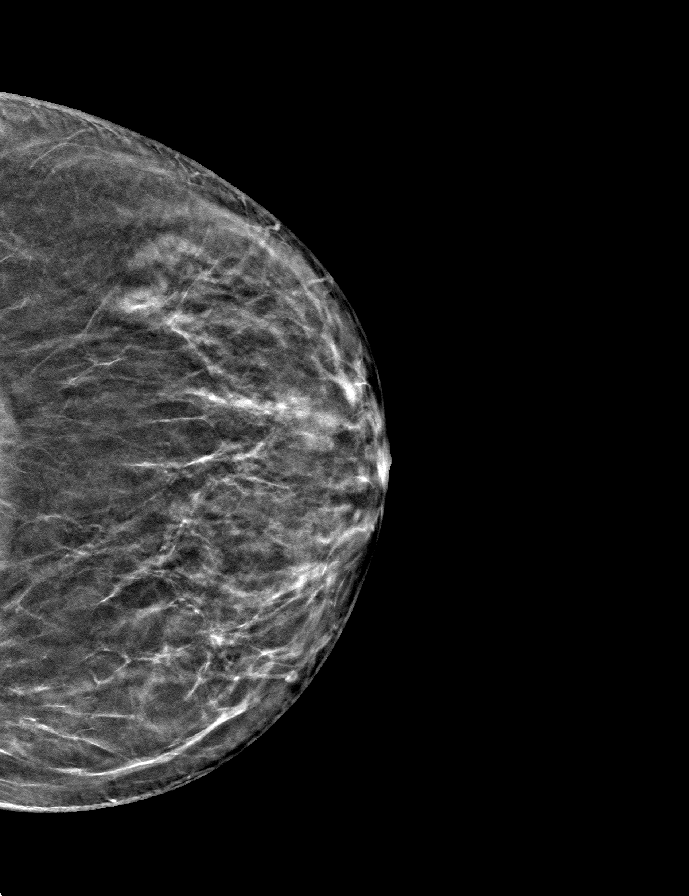

[9 of 24 positions shown; findings below may reference images not displayed]

ACR Breast Density Category b: There are scattered areas of
fibroglandular density.
FINDINGS: There are no findings suspicious for malignancy. The images were
evaluated with computer-aided detection.
IMPRESSION: No mammographic evidence of malignancy. A result letter of this
screening mammogram will be mailed directly to the patient.

RECOMMENDATION:
Screening mammogram in one year. (Code:WJ-I-BG6)

BI-RADS CATEGORY  1: Negative.

## 2023-02-04 ENCOUNTER — Ambulatory Visit: Payer: 59 | Admitting: Family Medicine

## 2023-02-04 ENCOUNTER — Other Ambulatory Visit: Payer: Self-pay | Admitting: Family Medicine

## 2023-02-09 ENCOUNTER — Ambulatory Visit: Payer: 59 | Admitting: Family Medicine

## 2023-02-16 ENCOUNTER — Encounter: Payer: Self-pay | Admitting: Family Medicine

## 2023-02-16 ENCOUNTER — Other Ambulatory Visit: Payer: Self-pay

## 2023-02-16 DIAGNOSIS — Z1231 Encounter for screening mammogram for malignant neoplasm of breast: Secondary | ICD-10-CM

## 2023-02-17 ENCOUNTER — Ambulatory Visit: Payer: 59 | Admitting: Family Medicine

## 2023-02-19 ENCOUNTER — Encounter: Payer: Self-pay | Admitting: Family Medicine

## 2023-02-19 ENCOUNTER — Ambulatory Visit (INDEPENDENT_AMBULATORY_CARE_PROVIDER_SITE_OTHER): Payer: 59 | Admitting: Family Medicine

## 2023-02-19 VITALS — BP 110/80 | HR 74 | Temp 97.9°F | Ht 59.5 in | Wt 126.2 lb

## 2023-02-19 DIAGNOSIS — E1169 Type 2 diabetes mellitus with other specified complication: Secondary | ICD-10-CM | POA: Diagnosis not present

## 2023-02-19 DIAGNOSIS — Z7984 Long term (current) use of oral hypoglycemic drugs: Secondary | ICD-10-CM

## 2023-02-19 LAB — POCT GLYCOSYLATED HEMOGLOBIN (HGB A1C): Hemoglobin A1C: 6.6 % — AB (ref 4.0–5.6)

## 2023-02-19 NOTE — Progress Notes (Signed)
Patient ID: Natalie Hudson, female    DOB: 10/06/1960, 62 y.o.   MRN: [id number]  This visit was conducted in person.  BP 110/80 (BP Location: Left Arm, Patient Position: Sitting, Cuff Size: Normal)   Pulse 74   Temp 97.9 F (36.6 C) (Oral)   Ht 4' 11.5" (1.511 m)   Wt 126 lb 3.2 oz (57.2 kg)   SpO2 96%   BMI 25.06 kg/m    CC:  Chief Complaint  Patient presents with   Medical Management of Chronic Issues    3 months f/u for A1c check. Pt reports she fasted since 8:30am . Glucose today was 108 at 2:00pm    Subjective:   HPI: Natalie Hudson is a 62 y.o. female presenting on 02/19/2023 for Medical Management of Chronic Issues (3 months f/u for A1c check. Pt reports she fasted since 8:30am . Glucose today was 108 at 2:00pm)  Diabetes:  Well controlled on glipizide 2.5 mg XL p.o. daily (she was having some lows on 5 mg, so decreased dose)  Eating less carbs.  She is walking 3-4 days a week. Lab Results  Component Value Date   HGBA1C 6.6 (A) 02/19/2023  Using medications without difficulties: Hypoglycemic episodes:  no lows. Hyperglycemic episodes:  90-108 Feet problems: no ulcers Blood Sugars averaging:   Not checking fasting eye exam within last year: due      BP Readings from Last 3 Encounters:  02/19/23 110/80  11/03/22 112/74  06/09/22 108/70   Relevant past medical, surgical, family and social history reviewed and updated as indicated. Interim medical history since our last visit reviewed. Allergies and medications reviewed and updated. Outpatient Medications Prior to Visit  Medication Sig Dispense Refill   acetaminophen (TYLENOL) 500 MG tablet Take 2 tablets (1,000 mg total) by mouth every 6 (six) hours as needed. 30 tablet 0   Ascorbic Acid (VITAMIN C WITH ROSE HIPS) 500 MG tablet Take 500 mg by mouth daily.     Biotin 5000 MCG CAPS Take 5,000 mcg by mouth daily.     Calcium Carbonate-Simethicone (ALKA-SELTZER HEARTBURN + GAS) 750-80 MG CHEW Chew 1  Application by mouth daily as needed (for gas or heartburn).     Cholecalciferol (VITAMIN D3) 25 MCG (1000 UT) CAPS Take 1 capsule by mouth daily.     cyanocobalamin 1000 MCG tablet Take 1,000 mcg by mouth daily.     glipiZIDE (GLUCOTROL XL) 2.5 MG 24 hr tablet TAKE ONE TABLET BY MOUTH ONCE A DAY WITH BREAKFAST 90 tablet 0   hydrOXYzine (ATARAX) 10 MG tablet Take 1 tablet (10 mg total) by mouth 3 (three) times daily as needed for anxiety. 30 tablet 0   triamcinolone cream (KENALOG) 0.1 % Apply topically 2 (two) times daily as needed.     atorvastatin (LIPITOR) 10 MG tablet Take 1 tablet (10 mg total) by mouth every Monday, Wednesday, and Friday. (Patient not taking: Reported on 11/03/2022) 12 tablet 11   No facility-administered medications prior to visit.     Per HPI unless specifically indicated in ROS section below Review of Systems  Constitutional:  Negative for fatigue and fever.  HENT:  Negative for congestion.   Eyes:  Negative for pain.  Respiratory:  Negative for cough and shortness of breath.   Cardiovascular:  Negative for chest pain, palpitations and leg swelling.  Gastrointestinal:  Negative for abdominal pain.  Genitourinary:  Negative for dysuria and vaginal bleeding.  Musculoskeletal:  Negative for back pain.  Skin:  Positive for rash.  Neurological:  Negative for syncope, light-headedness and headaches.  Psychiatric/Behavioral:  Negative for dysphoric mood.    Objective:  BP 110/80 (BP Location: Left Arm, Patient Position: Sitting, Cuff Size: Normal)   Pulse 74   Temp 97.9 F (36.6 C) (Oral)   Ht 4' 11.5" (1.511 m)   Wt 126 lb 3.2 oz (57.2 kg)   SpO2 96%   BMI 25.06 kg/m   Wt Readings from Last 3 Encounters:  02/19/23 126 lb 3.2 oz (57.2 kg)  11/03/22 131 lb (59.4 kg)  06/09/22 130 lb 6 oz (59.1 kg)      Physical Exam Constitutional:      General: She is not in acute distress.    Appearance: Normal appearance. She is well-developed. She is not ill-appearing  or toxic-appearing.  HENT:     Head: Normocephalic.     Right Ear: Hearing, tympanic membrane, ear canal and external ear normal. Tympanic membrane is not erythematous, retracted or bulging.     Left Ear: Hearing, tympanic membrane, ear canal and external ear normal. Tympanic membrane is not erythematous, retracted or bulging.     Nose: No mucosal edema or rhinorrhea.     Right Sinus: No maxillary sinus tenderness or frontal sinus tenderness.     Left Sinus: No maxillary sinus tenderness or frontal sinus tenderness.     Mouth/Throat:     Pharynx: Uvula midline.  Eyes:     General: Lids are normal. Lids are everted, no foreign bodies appreciated.     Conjunctiva/sclera: Conjunctivae normal.     Pupils: Pupils are equal, round, and reactive to light.  Neck:     Thyroid: No thyroid mass or thyromegaly.     Vascular: No carotid bruit.     Trachea: Trachea normal.  Cardiovascular:     Rate and Rhythm: Normal rate and regular rhythm.     Pulses: Normal pulses.     Heart sounds: Normal heart sounds, S1 normal and S2 normal. No murmur heard.    No friction rub. No gallop.  Pulmonary:     Effort: Pulmonary effort is normal. No tachypnea or respiratory distress.     Breath sounds: Normal breath sounds. No decreased breath sounds, wheezing, rhonchi or rales.  Abdominal:     General: Bowel sounds are normal.     Palpations: Abdomen is soft.     Tenderness: There is no abdominal tenderness.  Musculoskeletal:     Cervical back: Normal range of motion and neck supple.  Skin:    General: Skin is warm and dry.     Findings: No rash.  Neurological:     Mental Status: She is alert.  Psychiatric:        Mood and Affect: Mood is not anxious or depressed.        Speech: Speech normal.        Behavior: Behavior normal. Behavior is cooperative.        Thought Content: Thought content normal.        Judgment: Judgment normal.     Results for orders placed or performed in visit on 02/19/23  POC  HgB A1c  Result Value Ref Range   Hemoglobin A1C 6.6 (A) 4.0 - 5.6 %   HbA1c POC (<> result, manual entry)     HbA1c, POC (prediabetic range)     HbA1c, POC (controlled diabetic range)       COVID 19 screen:  No recent travel or known exposure to COVID19 The  patient denies respiratory symptoms of COVID 19 at this time. The importance of social distancing was discussed today.   Assessment and Plan    Problem List Items Addressed This Visit     Type 2 diabetes mellitus with other specified complication (HCC) - Primary    Chronic, excellent improvement with lifestyle change.  A1c now less than 7.  Continue working on healthy lifestyle changes. Request eye exam.  Per patient she had this done in August.  Glipizide XL 2.5 mg daily.        Relevant Orders   POC HgB A1c (Completed)    Request to Add on to [facility] eye visit made.  Kerby Nora, MD

## 2023-02-19 NOTE — Assessment & Plan Note (Signed)
Chronic, excellent improvement with lifestyle change.  A1c now less than 7.  Continue working on healthy lifestyle changes. Request eye exam.  Per patient she had this done in August.  Glipizide XL 2.5 mg daily.

## 2023-03-04 ENCOUNTER — Ambulatory Visit
Admission: RE | Admit: 2023-03-04 | Discharge: 2023-03-04 | Disposition: A | Discharge status: Home / Self Care | Payer: 59 | Source: Ambulatory Visit | Attending: Family Medicine | Admitting: Family Medicine

## 2023-03-04 DIAGNOSIS — Z1231 Encounter for screening mammogram for malignant neoplasm of breast: Secondary | ICD-10-CM | POA: Diagnosis not present

## 2023-04-10 DIAGNOSIS — Z833 Family history of diabetes mellitus: Secondary | ICD-10-CM | POA: Diagnosis not present

## 2023-04-10 DIAGNOSIS — Z87891 Personal history of nicotine dependence: Secondary | ICD-10-CM | POA: Diagnosis not present

## 2023-04-10 DIAGNOSIS — F419 Anxiety disorder, unspecified: Secondary | ICD-10-CM | POA: Diagnosis not present

## 2023-04-10 DIAGNOSIS — Z809 Family history of malignant neoplasm, unspecified: Secondary | ICD-10-CM | POA: Diagnosis not present

## 2023-04-10 DIAGNOSIS — Z9049 Acquired absence of other specified parts of digestive tract: Secondary | ICD-10-CM | POA: Diagnosis not present

## 2023-04-10 DIAGNOSIS — Z008 Encounter for other general examination: Secondary | ICD-10-CM | POA: Diagnosis not present

## 2023-04-10 DIAGNOSIS — E119 Type 2 diabetes mellitus without complications: Secondary | ICD-10-CM | POA: Diagnosis not present

## 2023-04-10 DIAGNOSIS — Z823 Family history of stroke: Secondary | ICD-10-CM | POA: Diagnosis not present

## 2023-04-10 DIAGNOSIS — Z7984 Long term (current) use of oral hypoglycemic drugs: Secondary | ICD-10-CM | POA: Diagnosis not present

## 2023-04-10 DIAGNOSIS — Z8249 Family history of ischemic heart disease and other diseases of the circulatory system: Secondary | ICD-10-CM | POA: Diagnosis not present

## 2023-04-10 DIAGNOSIS — I10 Essential (primary) hypertension: Secondary | ICD-10-CM | POA: Diagnosis not present

## 2023-04-10 DIAGNOSIS — E785 Hyperlipidemia, unspecified: Secondary | ICD-10-CM | POA: Diagnosis not present

## 2023-04-26 ENCOUNTER — Other Ambulatory Visit: Payer: Self-pay | Admitting: Family Medicine

## 2023-07-17 ENCOUNTER — Other Ambulatory Visit: Payer: Self-pay | Admitting: Family Medicine

## 2023-07-19 ENCOUNTER — Telehealth: Payer: Self-pay | Admitting: *Deleted

## 2023-07-19 DIAGNOSIS — E1169 Type 2 diabetes mellitus with other specified complication: Secondary | ICD-10-CM

## 2023-07-19 NOTE — Telephone Encounter (Signed)
-----   Message from Alvina Chou sent at 07/19/2023  3:01 PM EST ----- Regarding: Lab orders for TUE, 3.18.25 Patient is scheduled for CPX labs, please order future labs, Thanks , Camelia Eng

## 2023-07-19 NOTE — Telephone Encounter (Signed)
 Spoke to pt, scheduled cpe for 08/16/23

## 2023-07-19 NOTE — Telephone Encounter (Signed)
 Please schedule CPE with fasting labs prior with Dr. Ermalene Searing for around 08/19/2023.  Please send back to me once scheduled.

## 2023-08-12 ENCOUNTER — Encounter: Payer: Self-pay | Admitting: Family Medicine

## 2023-08-12 ENCOUNTER — Other Ambulatory Visit (INDEPENDENT_AMBULATORY_CARE_PROVIDER_SITE_OTHER): Payer: Self-pay

## 2023-08-12 DIAGNOSIS — E785 Hyperlipidemia, unspecified: Secondary | ICD-10-CM

## 2023-08-12 DIAGNOSIS — E1169 Type 2 diabetes mellitus with other specified complication: Secondary | ICD-10-CM | POA: Diagnosis not present

## 2023-08-12 LAB — COMPREHENSIVE METABOLIC PANEL
ALT: 14 U/L (ref 0–35)
AST: 15 U/L (ref 0–37)
Albumin: 4.4 g/dL (ref 3.5–5.2)
Alkaline Phosphatase: 62 U/L (ref 39–117)
BUN: 17 mg/dL (ref 6–23)
CO2: 32 meq/L (ref 19–32)
Calcium: 9.3 mg/dL (ref 8.4–10.5)
Chloride: 102 meq/L (ref 96–112)
Creatinine, Ser: 0.67 mg/dL (ref 0.40–1.20)
GFR: 92.81 mL/min (ref >60.00)
Glucose, Bld: 152 mg/dL — ABNORMAL HIGH (ref 70–99)
Potassium: 4.6 meq/L (ref 3.5–5.1)
Sodium: 138 meq/L (ref 135–145)
Total Bilirubin: 0.6 mg/dL (ref 0.2–1.2)
Total Protein: 7.1 g/dL (ref 6.0–8.3)

## 2023-08-12 LAB — LIPID PANEL
Cholesterol: 187 mg/dL (ref 0–200)
HDL: 78.9 mg/dL (ref >39.00)
LDL Cholesterol: 93 mg/dL (ref 0–99)
NonHDL: 108.53
Total CHOL/HDL Ratio: 2
Triglycerides: 77 mg/dL (ref 0.0–149.0)
VLDL: 15.4 mg/dL (ref 0.0–40.0)

## 2023-08-12 LAB — HEMOGLOBIN A1C: Hgb A1c MFr Bld: 7.4 % — ABNORMAL HIGH (ref 4.6–6.5)

## 2023-08-12 NOTE — Progress Notes (Signed)
 No critical labs need to be addressed urgently. We will discuss labs in detail at upcoming office visit.

## 2023-08-19 ENCOUNTER — Ambulatory Visit (INDEPENDENT_AMBULATORY_CARE_PROVIDER_SITE_OTHER): Payer: 59 | Admitting: Family Medicine

## 2023-08-19 ENCOUNTER — Encounter: Payer: Self-pay | Admitting: Family Medicine

## 2023-08-19 VITALS — BP 100/70 | HR 77 | Temp 97.8°F | Ht 59.0 in | Wt 128.5 lb

## 2023-08-19 DIAGNOSIS — E785 Hyperlipidemia, unspecified: Secondary | ICD-10-CM

## 2023-08-19 DIAGNOSIS — M791 Myalgia, unspecified site: Secondary | ICD-10-CM

## 2023-08-19 DIAGNOSIS — Z7984 Long term (current) use of oral hypoglycemic drugs: Secondary | ICD-10-CM

## 2023-08-19 DIAGNOSIS — Z0000 Encounter for general adult medical examination without abnormal findings: Secondary | ICD-10-CM | POA: Diagnosis not present

## 2023-08-19 DIAGNOSIS — E1169 Type 2 diabetes mellitus with other specified complication: Secondary | ICD-10-CM

## 2023-08-19 DIAGNOSIS — R590 Localized enlarged lymph nodes: Secondary | ICD-10-CM

## 2023-08-19 DIAGNOSIS — T466X5A Adverse effect of antihyperlipidemic and antiarteriosclerotic drugs, initial encounter: Secondary | ICD-10-CM

## 2023-08-19 LAB — HM DIABETES FOOT EXAM

## 2023-08-19 NOTE — Patient Instructions (Signed)
 Can try low atorvastatin 1/2 three days a week.  Can CoQ10 for SE if needed.

## 2023-08-19 NOTE — Assessment & Plan Note (Addendum)
 Side effect to statin in the past, she is  willing to retry  lower dose and co-Q10.

## 2023-08-19 NOTE — Progress Notes (Signed)
 Patient ID: Natalie Hudson, female    DOB: [birth year], 63 y.o.   MRN: [id number]  This visit was conducted in person.  Blood pressure 100/70, pulse 77, temperature 97.8 F (36.6 C), temperature source Temporal, height 4\' 11"  (1.499 m), weight 128 lb 8 oz (58.3 kg), SpO2 99%.  CC: Chief Complaint  Patient presents with   Annual Exam    Subjective:   HPI: Natalie Hudson is a 63 y.o. female presenting on 08/19/2023 for Annual Exam The patient presents for  complete physical and review of chronic health problems. He/She also has the following acute concerns today:  Diabetes: worsened control on glipizide low dose.. not eating well.. has now restarted walking. Lab Results  Component Value Date   HGBA1C 7.4 (H) 08/12/2023  Using medications without difficulties: Hypoglycemic episodes:none Hyperglycemic episodes:none Feet problems: no ulcers Blood Sugars averaging: CBGs  137 eye exam within last year:  yes  Elevated Cholesterol:  LDL at goal  < 100 with DM but not on statin.  SE to atorvastatin. Lab Results  Component Value Date   CHOL 187 08/12/2023   HDL 78.90 08/12/2023   LDLCALC 93 08/12/2023   TRIG 77.0 08/12/2023   CHOLHDL 2 08/12/2023  using medications without problems: Muscle aches:  Diet compliance: moderate.. poor during the holiday's Exercise: none Other complaints:    Relevant past medical, surgical, family and social history reviewed and updated as indicated. Interim medical history since our last visit reviewed. Allergies and medications reviewed and updated. Outpatient Medications Prior to Visit  Medication Sig Dispense Refill   acetaminophen (TYLENOL) 500 MG tablet Take 2 tablets (1,000 mg total) by mouth every 6 (six) hours as needed. 30 tablet 0   Ascorbic Acid (VITAMIN C WITH ROSE HIPS) 500 MG tablet Take 500 mg by mouth daily.     Biotin 5000 MCG CAPS Take 5,000 mcg by mouth daily.     Calcium Carbonate-Simethicone (ALKA-SELTZER HEARTBURN + GAS)  750-80 MG CHEW Chew 1 Application by mouth daily as needed (for gas or heartburn).     Cholecalciferol (VITAMIN D3) 25 MCG (1000 UT) CAPS Take 1 capsule by mouth daily.     cyanocobalamin 1000 MCG tablet Take 1,000 mcg by mouth daily.     glipiZIDE (GLUCOTROL XL) 2.5 MG 24 hr tablet TAKE ONE TABLET BY MOUTH ONCE A DAY WITH BREAKFAST 90 tablet 0   hydrOXYzine (ATARAX) 10 MG tablet Take 1 tablet (10 mg total) by mouth 3 (three) times daily as needed for anxiety. 30 tablet 0   atorvastatin (LIPITOR) 10 MG tablet Take 1 tablet (10 mg total) by mouth every Monday, Wednesday, and Friday. (Patient not taking: Reported on 11/03/2022) 12 tablet 11   No facility-administered medications prior to visit.     Per HPI unless specifically indicated in ROS section below Review of Systems  Constitutional:  Negative for fatigue and fever.  HENT:  Negative for congestion.   Eyes:  Negative for pain.  Respiratory:  Negative for cough and shortness of breath.   Cardiovascular:  Negative for chest pain, palpitations and leg swelling.  Gastrointestinal:  Negative for abdominal pain.  Genitourinary:  Negative for dysuria and vaginal bleeding.  Musculoskeletal:  Negative for back pain.  Neurological:  Negative for syncope, light-headedness and headaches.  Psychiatric/Behavioral:  Negative for dysphoric mood.    Objective:  BP 100/70 (BP Location: Left Arm, Patient Position: Sitting, Cuff Size: Normal)   Pulse 77   Temp 97.8 F (36.6 C) (Temporal)  Ht 4\' 11"  (1.499 m)   Wt 128 lb 8 oz (58.3 kg)   SpO2 99%   BMI 25.95 kg/m   Wt Readings from Last 3 Encounters:  08/19/23 128 lb 8 oz (58.3 kg)  02/19/23 126 lb 3.2 oz (57.2 kg)  11/03/22 131 lb (59.4 kg)      Physical Exam Constitutional:      General: She is not in acute distress.    Appearance: Normal appearance. She is well-developed. She is not ill-appearing or toxic-appearing.  HENT:     Head: Normocephalic.     Right Ear: Hearing, tympanic  membrane, ear canal and external ear normal.     Left Ear: Hearing, tympanic membrane, ear canal and external ear normal.     Nose: Nose normal.  Eyes:     General: Lids are normal. Lids are everted, no foreign bodies appreciated.     Conjunctiva/sclera: Conjunctivae normal.     Pupils: Pupils are equal, round, and reactive to light.  Neck:     Thyroid: No thyroid mass or thyromegaly.     Vascular: No carotid bruit.     Trachea: Trachea normal.  Cardiovascular:     Rate and Rhythm: Normal rate and regular rhythm.     Heart sounds: Normal heart sounds, S1 normal and S2 normal. No murmur heard.    No gallop.  Pulmonary:     Effort: Pulmonary effort is normal. No respiratory distress.     Breath sounds: Normal breath sounds. No wheezing, rhonchi or rales.  Abdominal:     General: Bowel sounds are normal. There is no distension or abdominal bruit.     Palpations: Abdomen is soft. There is no fluid wave or mass.     Tenderness: There is no abdominal tenderness. There is no guarding or rebound.     Hernia: No hernia is present.  Genitourinary:    Exam position: Supine.     Labia:        Right: No rash, tenderness or lesion.        Left: No rash, tenderness or lesion.      Vagina: Normal.     Cervix: No cervical motion tenderness, discharge or friability.     Uterus: Not enlarged and not tender.      Adnexa:        Right: No mass, tenderness or fullness.         Left: No mass, tenderness or fullness.    Musculoskeletal:     Cervical back: Normal range of motion and neck supple.  Lymphadenopathy:     Cervical: Cervical adenopathy present.     Right cervical: No superficial, deep or posterior cervical adenopathy.    Left cervical: Superficial cervical adenopathy present. No deep or posterior cervical adenopathy.     Upper Body:     Right upper body: No supraclavicular, axillary, pectoral or epitrochlear adenopathy.     Left upper body: No supraclavicular, axillary, pectoral or  epitrochlear adenopathy.  Skin:    General: Skin is warm and dry.     Findings: No rash.  Neurological:     Mental Status: She is alert.     Cranial Nerves: No cranial nerve deficit.     Sensory: No sensory deficit.  Psychiatric:        Mood and Affect: Mood is not anxious or depressed.        Speech: Speech normal.        Behavior: Behavior normal. Behavior is cooperative.  Judgment: Judgment normal.    Diabetic foot exam: Normal inspection No skin breakdown No calluses  Normal DP pulses Normal sensation to light touch and monofilament Nails normal     Results for orders placed or performed in visit on 08/19/23  HM DIABETES EYE EXAM   Collection Time: 12/29/22 12:00 AM  Result Value Ref Range   HM Diabetic Eye Exam No Retinopathy No Retinopathy  HM DIABETES FOOT EXAM   Collection Time: 08/19/23 12:00 AM  Result Value Ref Range   HM Diabetic Foot Exam done     This visit occurred during the SARS-CoV-2 public health emergency.  Safety protocols were in place, including screening questions prior to the visit, additional usage of staff PPE, and extensive cleaning of exam room while observing appropriate contact time as indicated for disinfecting solutions.   COVID 19 screen:  No recent travel or known exposure to COVID19 The patient denies respiratory symptoms of COVID 19 at this time. The importance of social distancing was discussed today.   Assessment and Plan The patient's preventative maintenance and recommended screening tests for an annual wellness exam were reviewed in full today. Brought up to date unless services declined.  Counselled on the importance of diet, exercise, and its role in overall health and mortality. The patient's FH and SH was reviewed, including their home life, tobacco status, and drug and alcohol status.   Vaccines: Uptodate flu   and PNA 23 vaccine  Uptodate with  Tdap.   Consider shingrix vaccine and  discussed COVID19 vaccine side  effects and benefits. Strongly encouraged the patient to get the vaccine. Questions answered. PAP/DVE: 2010, 2011.. 1610,9604, 2023 normal no HPV q5 years Mammo: 02/2023 Colon: nml 01/2022 repeat in 10 years.    Nonsmoker Hep C negative. HIV: refused.    Problem List Items Addressed This Visit     Hyperlipidemia associated with type 2 diabetes mellitus (HCC)    Chronic, well-controlled at goal LDL  less than 100.  Was on atorvastatin 10 mg daily unable to tolerated caused myalgia. She is going to work on diet changes.. will try 5 mg three days a week.  Trial of CoQ 10 for SE.      Myalgia due to statin   Side effect to statin in the past, she is  willing to retry  lower dose and co-Q10.      Type 2 diabetes mellitus with other specified complication (HCC)   Chronic,worsened control  Glipizide XL 2.5 mg daily.        Other Visit Diagnoses       Routine general medical examination at a health care facility    -  Primary        Kerby Nora, MD

## 2023-08-19 NOTE — Assessment & Plan Note (Signed)
 Negative [place] in 2024.. no change.

## 2023-08-19 NOTE — Assessment & Plan Note (Addendum)
 Chronic, well-controlled at goal LDL  less than 100.  Was on atorvastatin 10 mg daily unable to tolerated caused myalgia. She is going to work on diet changes.. will try 5 mg three days a week.  Trial of CoQ 10 for SE.

## 2023-08-19 NOTE — Assessment & Plan Note (Signed)
 Chronic,worsened control  Glipizide XL 2.5 mg daily.

## 2023-09-16 ENCOUNTER — Encounter: Payer: Self-pay | Admitting: Family Medicine

## 2023-09-17 MED ORDER — ATORVASTATIN CALCIUM 10 MG PO TABS: 5.0000 mg | ORAL_TABLET | ORAL | 3 refills | Status: DC

## 2023-10-13 ENCOUNTER — Other Ambulatory Visit: Payer: Self-pay | Admitting: Family Medicine

## 2023-11-18 ENCOUNTER — Ambulatory Visit (INDEPENDENT_AMBULATORY_CARE_PROVIDER_SITE_OTHER): Admitting: Family Medicine

## 2023-11-18 ENCOUNTER — Encounter: Payer: Self-pay | Admitting: Family Medicine

## 2023-11-18 VITALS — BP 120/74 | HR 84 | Temp 97.9°F | Ht 59.0 in | Wt 127.5 lb

## 2023-11-18 DIAGNOSIS — M791 Myalgia, unspecified site: Secondary | ICD-10-CM

## 2023-11-18 DIAGNOSIS — E1169 Type 2 diabetes mellitus with other specified complication: Secondary | ICD-10-CM

## 2023-11-18 DIAGNOSIS — Z7984 Long term (current) use of oral hypoglycemic drugs: Secondary | ICD-10-CM

## 2023-11-18 DIAGNOSIS — T466X5A Adverse effect of antihyperlipidemic and antiarteriosclerotic drugs, initial encounter: Secondary | ICD-10-CM

## 2023-11-18 DIAGNOSIS — E785 Hyperlipidemia, unspecified: Secondary | ICD-10-CM

## 2023-11-18 LAB — POCT GLYCOSYLATED HEMOGLOBIN (HGB A1C): Hemoglobin A1C: 6.9 % — AB (ref 4.0–5.6)

## 2023-11-18 NOTE — Assessment & Plan Note (Signed)
 SE even on low dose statin

## 2023-11-18 NOTE — Assessment & Plan Note (Signed)
 Retrial of statin.. intolerant at even low dose.  Will work on lifestly changes.

## 2023-11-18 NOTE — Progress Notes (Signed)
 Patient ID: Natalie Hudson, female    DOB: 1961-04-01, 63 y.o.   MRN: [id number]  This visit was conducted in person.  Blood pressure 100/70, pulse 77, temperature 97.8 F (36.6 C), temperature source Temporal, height 4' 11 (1.499 m), weight 128 lb 8 oz (58.3 kg), SpO2 99%.  CC: Chief Complaint  Patient presents with   Medical Management of Chronic Issues    Here for DM f/u.    Subjective:   HPI: Natalie Hudson is a 63 y.o. female presenting on 11/18/2023 for Medical Management of Chronic Issues (Here for DM f/u.)  Diabetes: Now improved control on glipizide  2.5 mg XL daily and back on low carbohydrate heart healthy diet.  She has cut out fast food, eating out. Cut out rice and pasta, sweet. Lab Results  Component Value Date   HGBA1C 6.9 (A) 11/18/2023  Using medications without difficulties: Hypoglycemic episodes:none Hyperglycemic episodes:none Feet problems: no ulcers Blood Sugars averaging: CBGs  137 eye exam within last year:  yes Due for urine microalbumin She is exercising every other day, walking 30 min.  She is not taking atorvastatin  for cholesterol.. had dizziness SE even on the lower dose. Tried it for 3 weeks and it id not resolve.   Relevant past medical, surgical, family and social history reviewed and updated as indicated. Interim medical history since our last visit reviewed. Allergies and medications reviewed and updated. Outpatient Medications Prior to Visit  Medication Sig Dispense Refill   acetaminophen  (TYLENOL ) 500 MG tablet Take 2 tablets (1,000 mg total) by mouth every 6 (six) hours as needed. 30 tablet 0   Ascorbic Acid (VITAMIN C WITH ROSE HIPS) 500 MG tablet Take 500 mg by mouth daily.     Biotin 5000 MCG CAPS Take 5,000 mcg by mouth daily.     Calcium  Carbonate-Simethicone (ALKA-SELTZER HEARTBURN + GAS) 750-80 MG CHEW Chew 1 Application by mouth daily as needed (for gas or heartburn).     Cholecalciferol (VITAMIN D3) 25 MCG (1000 UT) CAPS  Take 1 capsule by mouth daily.     cyanocobalamin 1000 MCG tablet Take 1,000 mcg by mouth daily.     glipiZIDE  (GLUCOTROL  XL) 2.5 MG 24 hr tablet TAKE ONE TABLET BY MOUTH ONCE A DAY WITH BREAKFAST 90 tablet 0   hydrOXYzine  (ATARAX ) 10 MG tablet Take 1 tablet (10 mg total) by mouth 3 (three) times daily as needed for anxiety. 30 tablet 0   atorvastatin  (LIPITOR) 10 MG tablet Take 0.5 tablets (5 mg total) by mouth 3 (three) times a week. (Patient not taking: Reported on 11/18/2023) 20 tablet 3   No facility-administered medications prior to visit.     Per HPI unless specifically indicated in ROS section below Review of Systems  Constitutional:  Negative for fatigue and fever.  HENT:  Negative for congestion.   Eyes:  Negative for pain.  Respiratory:  Negative for cough and shortness of breath.   Cardiovascular:  Negative for chest pain, palpitations and leg swelling.  Gastrointestinal:  Negative for abdominal pain.  Genitourinary:  Negative for dysuria and vaginal bleeding.  Musculoskeletal:  Negative for back pain.  Neurological:  Negative for syncope, light-headedness and headaches.  Psychiatric/Behavioral:  Negative for dysphoric mood.    Objective:  BP 120/74   Pulse 84   Temp 97.9 F (36.6 C) (Oral)   Ht 4' 11 (1.499 m)   Wt 127 lb 8 oz (57.8 kg)   SpO2 97%   BMI 25.75 kg/m  Wt Readings from Last 3 Encounters:  11/18/23 127 lb 8 oz (57.8 kg)  08/19/23 128 lb 8 oz (58.3 kg)  02/19/23 126 lb 3.2 oz (57.2 kg)      Physical Exam Constitutional:      General: She is not in acute distress.    Appearance: Normal appearance. She is well-developed. She is not ill-appearing or toxic-appearing.  HENT:     Head: Normocephalic.     Right Ear: Hearing, tympanic membrane, ear canal and external ear normal.     Left Ear: Hearing, tympanic membrane, ear canal and external ear normal.     Nose: Nose normal.   Eyes:     General: Lids are normal. Lids are everted, no foreign bodies  appreciated.     Conjunctiva/sclera: Conjunctivae normal.     Pupils: Pupils are equal, round, and reactive to light.   Neck:     Thyroid : No thyroid  mass or thyromegaly.     Vascular: No carotid bruit.     Trachea: Trachea normal.   Cardiovascular:     Rate and Rhythm: Normal rate and regular rhythm.     Heart sounds: Normal heart sounds, S1 normal and S2 normal. No murmur heard.    No gallop.  Pulmonary:     Effort: Pulmonary effort is normal. No respiratory distress.     Breath sounds: Normal breath sounds. No wheezing, rhonchi or rales.  Abdominal:     General: Bowel sounds are normal. There is no distension or abdominal bruit.     Palpations: Abdomen is soft. There is no fluid wave or mass.     Tenderness: There is no abdominal tenderness. There is no guarding or rebound.     Hernia: No hernia is present.  Genitourinary:    Exam position: Supine.     Labia:        Right: No rash, tenderness or lesion.        Left: No rash, tenderness or lesion.      Vagina: Normal.     Cervix: No cervical motion tenderness, discharge or friability.     Uterus: Not enlarged and not tender.      Adnexa:        Right: No mass, tenderness or fullness.         Left: No mass, tenderness or fullness.     Musculoskeletal:     Cervical back: Normal range of motion and neck supple.  Lymphadenopathy:     Cervical: Cervical adenopathy present.     Right cervical: No superficial, deep or posterior cervical adenopathy.    Left cervical: Superficial cervical adenopathy present. No deep or posterior cervical adenopathy.     Upper Body:     Right upper body: No supraclavicular, axillary, pectoral or epitrochlear adenopathy.     Left upper body: No supraclavicular, axillary, pectoral or epitrochlear adenopathy.   Skin:    General: Skin is warm and dry.     Findings: No rash.   Neurological:     Mental Status: She is alert.     Cranial Nerves: No cranial nerve deficit.     Sensory: No sensory  deficit.   Psychiatric:        Mood and Affect: Mood is not anxious or depressed.        Speech: Speech normal.        Behavior: Behavior normal. Behavior is cooperative.        Judgment: Judgment normal.  Results for orders placed or performed in visit on 11/18/23  POCT glycosylated hemoglobin (Hb A1C)   Collection Time: 11/18/23  2:26 PM  Result Value Ref Range   Hemoglobin A1C 6.9 (A) 4.0 - 5.6 %   HbA1c POC (<> result, manual entry)     HbA1c, POC (prediabetic range)     HbA1c, POC (controlled diabetic range)      This visit occurred during the SARS-CoV-2 public health emergency.  Safety protocols were in place, including screening questions prior to the visit, additional usage of staff PPE, and extensive cleaning of exam room while observing appropriate contact time as indicated for disinfecting solutions.   COVID 19 screen:  No recent travel or known exposure to COVID19 The patient denies respiratory symptoms of COVID 19 at this time. The importance of social distancing was discussed today.   Assessment and Plan  Problem List Items Addressed This Visit     Hyperlipidemia associated with type 2 diabetes mellitus (HCC)    Retrial of statin.. intolerant at even low dose.  Will work on lifestly changes.      Myalgia due to statin   SE even on low dose statin      Type 2 diabetes mellitus with other specified complication (HCC) - Primary   Chronic, significant improvement in control with lifestyle change on Glucotrol  XL 2.5 mg daily. Due for microalbumin test today.       Relevant Orders   POCT glycosylated hemoglobin (Hb A1C) (Completed)   Microalbumin / creatinine urine ratio     Greig Ring, MD

## 2023-11-18 NOTE — Assessment & Plan Note (Signed)
 Chronic, significant improvement in control with lifestyle change on Glucotrol  XL 2.5 mg daily. Due for microalbumin test today.

## 2023-11-19 ENCOUNTER — Ambulatory Visit: Payer: Self-pay | Admitting: Family Medicine

## 2023-11-19 LAB — MICROALBUMIN / CREATININE URINE RATIO
Creatinine,U: 41.1 mg/dL
Microalb Creat Ratio: UNDETERMINED mg/g (ref 0.0–30.0)
Microalb, Ur: <0.7 mg/dL

## 2023-12-30 ENCOUNTER — Ambulatory Visit: Payer: Self-pay

## 2023-12-30 ENCOUNTER — Ambulatory Visit
Admission: RE | Admit: 2023-12-30 | Discharge: 2023-12-30 | Disposition: A | Discharge status: Home / Self Care | Source: Ambulatory Visit | Attending: Family Medicine | Admitting: Family Medicine

## 2023-12-30 ENCOUNTER — Ambulatory Visit (INDEPENDENT_AMBULATORY_CARE_PROVIDER_SITE_OTHER): Admitting: Family Medicine

## 2023-12-30 ENCOUNTER — Encounter: Payer: Self-pay | Admitting: Family Medicine

## 2023-12-30 ENCOUNTER — Ambulatory Visit: Payer: Self-pay | Admitting: Family Medicine

## 2023-12-30 VITALS — BP 99/80 | HR 91 | Temp 98.2°F | Ht 59.0 in | Wt 127.1 lb

## 2023-12-30 DIAGNOSIS — R1030 Lower abdominal pain, unspecified: Secondary | ICD-10-CM | POA: Diagnosis present

## 2023-12-30 LAB — CBC WITH DIFFERENTIAL/PLATELET
Basophils Absolute: 0 K/uL (ref 0.0–0.1)
Basophils Relative: 0.3 % (ref 0.0–3.0)
Eosinophils Absolute: 0.1 K/uL (ref 0.0–0.7)
Eosinophils Relative: 1.2 % (ref 0.0–5.0)
HCT: 36.7 % (ref 36.0–46.0)
Hemoglobin: 12.4 g/dL (ref 12.0–15.0)
Lymphocytes Relative: 23.7 % (ref 12.0–46.0)
Lymphs Abs: 1.9 K/uL (ref 0.7–4.0)
MCHC: 33.8 g/dL (ref 30.0–36.0)
MCV: 89.7 fl (ref 78.0–100.0)
Monocytes Absolute: 0.7 K/uL (ref 0.1–1.0)
Monocytes Relative: 8.6 % (ref 3.0–12.0)
Neutro Abs: 5.3 K/uL (ref 1.4–7.7)
Neutrophils Relative %: 66.2 % (ref 43.0–77.0)
Platelets: 203 K/uL (ref 150.0–400.0)
RBC: 4.09 Mil/uL (ref 3.87–5.11)
RDW: 12.6 % (ref 11.5–15.5)
WBC: 8.1 K/uL (ref 4.0–10.5)

## 2023-12-30 LAB — COMPREHENSIVE METABOLIC PANEL WITH GFR
ALT: 11 U/L (ref 0–35)
AST: 13 U/L (ref 0–37)
Albumin: 4.2 g/dL (ref 3.5–5.2)
Alkaline Phosphatase: 59 U/L (ref 39–117)
BUN: 13 mg/dL (ref 6–23)
CO2: 29 meq/L (ref 19–32)
Calcium: 9.4 mg/dL (ref 8.4–10.5)
Chloride: 100 meq/L (ref 96–112)
Creatinine, Ser: 0.61 mg/dL (ref 0.40–1.20)
GFR: 94.67 mL/min (ref >60.00)
Glucose, Bld: 91 mg/dL (ref 70–99)
Potassium: 3.9 meq/L (ref 3.5–5.1)
Sodium: 139 meq/L (ref 135–145)
Total Bilirubin: 0.6 mg/dL (ref 0.2–1.2)
Total Protein: 7 g/dL (ref 6.0–8.3)

## 2023-12-30 LAB — POC URINALSYSI DIPSTICK (AUTOMATED)
Bilirubin, UA: NEGATIVE
Glucose, UA: NEGATIVE
Ketones, UA: NEGATIVE
Protein, UA: NEGATIVE
Spec Grav, UA: 1.015 (ref 1.010–1.025)
Urobilinogen, UA: NEGATIVE U/dL — AB
pH, UA: 6 (ref 5.0–8.0)

## 2023-12-30 MED ORDER — AMOXICILLIN-POT CLAVULANATE 875-125 MG PO TABS: 1.0000 | ORAL_TABLET | Freq: Two times a day (BID) | ORAL | 0 refills | Status: DC

## 2023-12-30 MED ORDER — IOHEXOL 300 MG/ML  SOLN
100.0000 mL | Freq: Once | INTRAMUSCULAR | Status: AC | PRN
  Administered 2023-12-30: 100 mL via INTRAVENOUS

## 2023-12-30 NOTE — Progress Notes (Signed)
 Patient ID: Natalie Hudson, female    DOB: 1960-08-29, 63 y.o.   MRN: [id number]  This visit was conducted in person.  BP 99/80   Pulse 91   Temp 98.2 F (36.8 C) (Oral)   Ht 4' 11 (1.499 m)   Wt 127 lb 2 oz (57.7 kg)   SpO2 96%   BMI 25.68 kg/m    CC:  Chief Complaint  Patient presents with   Abdominal Pain    C/o Abdominal Pain started 12/28/23. Normal BM . Dull pain     Subjective:   HPI: Natalie Hudson is a 63 y.o. female  with history of DM2 presenting on 12/30/2023 for Abdominal Pain (C/o Abdominal Pain started 12/28/23. Normal BM . Dull pain )  New onset abdominal pain  in last 2 days 3 days prior IBS spell with looser stool... this resolved but now new dull ache in lower abdomen  Keeping her up at night  Not relieve by BM like usual.  Seems to be some better today. 4-6/10  Movement slightly worsens ie bending over.  No change with food.  No dysuria, no freq, no urgency. No fever  No blood in stool, no blood  No new foods    No D/C, Has Nml BP daily   Has history of IBS D  9/223 colonoscopy:  diverticula seen.    CT Abd/pelvis  01/2022:IMPRESSION: 2.7 cm gallstone. Marked gallbladder wall thickening measuring up to 11 mm. Appearance is concerning for possible cholecystitis. Recommend clinical correlation. This could be further evaluated with ultrasound if felt clinically indicated.  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 No stones or hydronephrosis.  S/P cholecystectomy in 2023 Has gallbladder and  uterus/ovaries Relevant past medical, surgical, family and social history reviewed and updated as indicated. Interim medical history since our last visit reviewed. Allergies and medications reviewed and updated. Outpatient Medications Prior to Visit  Medication Sig Dispense Refill   acetaminophen  (TYLENOL ) 500 MG tablet Take 2 tablets (1,000 mg total) by mouth every 6 (six) hours as needed. 30 tablet 0   Ascorbic Acid (VITAMIN C WITH ROSE  HIPS) 500 MG tablet Take 500 mg by mouth daily.     atorvastatin  (LIPITOR) 10 MG tablet Take 0.5 tablets (5 mg total) by mouth 3 (three) times a week. 20 tablet 3   Biotin 5000 MCG CAPS Take 5,000 mcg by mouth daily.     Calcium  Carbonate-Simethicone (ALKA-SELTZER HEARTBURN + GAS) 750-80 MG CHEW Chew 1 Application by mouth daily as needed (for gas or heartburn).     Cholecalciferol (VITAMIN D3) 25 MCG (1000 UT) CAPS Take 1 capsule by mouth daily.     cyanocobalamin 1000 MCG tablet Take 1,000 mcg by mouth daily.     glipiZIDE  (GLUCOTROL  XL) 2.5 MG 24 hr tablet TAKE ONE TABLET BY MOUTH ONCE A DAY WITH BREAKFAST 90 tablet 0   hydrOXYzine  (ATARAX ) 10 MG tablet Take 1 tablet (10 mg total) by mouth 3 (three) times daily as needed for anxiety. 30 tablet 0   No facility-administered medications prior to visit.     Per HPI unless specifically indicated in ROS section below Review of Systems  Constitutional:  Negative for fatigue and fever.  HENT:  Negative for congestion.   Eyes:  Negative for pain.  Respiratory:  Negative for cough and shortness of breath.   Cardiovascular:  Negative for chest pain, palpitations and leg swelling.  Gastrointestinal:  Positive for abdominal pain and diarrhea.  Genitourinary:  Negative for dysuria, frequency and vaginal bleeding.  Musculoskeletal:  Negative for back pain.  Neurological:  Negative for syncope, light-headedness and headaches.  Psychiatric/Behavioral:  Negative for dysphoric mood.    Objective:  BP 99/80   Pulse 91   Temp 98.2 F (36.8 C) (Oral)   Ht 4' 11 (1.499 m)   Wt 127 lb 2 oz (57.7 kg)   SpO2 96%   BMI 25.68 kg/m   Wt Readings from Last 3 Encounters:  12/30/23 127 lb 2 oz (57.7 kg)  11/18/23 127 lb 8 oz (57.8 kg)  08/19/23 128 lb 8 oz (58.3 kg)      Physical Exam Constitutional:      General: She is not in acute distress.    Appearance: Normal appearance. She is well-developed. She is not ill-appearing or toxic-appearing.   HENT:     Head: Normocephalic.     Right Ear: Hearing, tympanic membrane, ear canal and external ear normal. Tympanic membrane is not erythematous, retracted or bulging.     Left Ear: Hearing, tympanic membrane, ear canal and external ear normal. Tympanic membrane is not erythematous, retracted or bulging.     Nose: No mucosal edema or rhinorrhea.     Right Sinus: No maxillary sinus tenderness or frontal sinus tenderness.     Left Sinus: No maxillary sinus tenderness or frontal sinus tenderness.     Mouth/Throat:     Pharynx: Uvula midline.  Eyes:     General: Lids are normal. Lids are everted, no foreign bodies appreciated.     Conjunctiva/sclera: Conjunctivae normal.     Pupils: Pupils are equal, round, and reactive to light.  Neck:     Thyroid : No thyroid  mass or thyromegaly.     Vascular: No carotid bruit.     Trachea: Trachea normal.  Cardiovascular:     Rate and Rhythm: Normal rate and regular rhythm.     Pulses: Normal pulses.     Heart sounds: Normal heart sounds, S1 normal and S2 normal. No murmur heard.    No friction rub. No gallop.  Pulmonary:     Effort: Pulmonary effort is normal. No tachypnea or respiratory distress.     Breath sounds: Normal breath sounds. No decreased breath sounds, wheezing, rhonchi or rales.  Abdominal:     General: Bowel sounds are normal.     Palpations: Abdomen is soft.     Tenderness: There is abdominal tenderness in the suprapubic area and left lower quadrant. There is guarding and rebound. There is no right CVA tenderness or left CVA tenderness.     Hernia: No hernia is present.     Comments: Pt yelped when pressure place on abdomen  Musculoskeletal:     Cervical back: Normal range of motion and neck supple.  Skin:    General: Skin is warm and dry.     Findings: No rash.  Neurological:     Mental Status: She is alert.  Psychiatric:        Mood and Affect: Mood is not anxious or depressed.        Speech: Speech normal.         Behavior: Behavior normal. Behavior is cooperative.        Thought Content: Thought content normal.        Judgment: Judgment normal.       Results for orders placed or performed in visit on 11/18/23  POCT glycosylated hemoglobin (Hb A1C)   Collection Time: 11/18/23  2:26  PM  Result Value Ref Range   Hemoglobin A1C 6.9 (A) 4.0 - 5.6 %   HbA1c POC (<> result, manual entry)     HbA1c, POC (prediabetic range)     HbA1c, POC (controlled diabetic range)    Microalbumin / creatinine urine ratio   Collection Time: 11/18/23  2:56 PM  Result Value Ref Range   Microalb, Ur <0.7 mg/dL   Creatinine,U 58.8 mg/dL   Microalb Creat Ratio Unable to calculate 0.0 - 30.0 mg/g    Assessment and Plan  Lower abdominal pain Assessment & Plan: Acute, concern for intra-abdominal inflammation given rebound and guarding on abdominal exam.  Pain predominant on left lower quadrant but continues across suprapubic and right lower quadrant region. Will evaluate with urinalysis, CBC, complete metabolic panel. Given patient with history of diverticulosis I have a high level concern for acute diverticulitis.  Less likely ovarian, uterine pathology.  Appendicitis on the differential but not clearly associated given primary pain is in left lower quadrant and patient without vomiting or fever.  Patient does have history of irritable bowel syndrome but no current change in bowel movements.  Symptoms could possibly be from pain predominant IBS.  Will send for acute CT abdomen and pelvis to rule out diverticulitis.  Routine and ER precautions provided.  Patient instructed to go to ER if fever, unable to keep down liquids or severe abdominal pain.  Orders: -     CBC with Differential/Platelet -     Comprehensive metabolic panel with GFR -     CT ABDOMEN PELVIS W CONTRAST; Future    Addendum: UA unremarkable.  No follow-ups on file.   Greig Ring, MD

## 2023-12-30 NOTE — Telephone Encounter (Signed)
 Noted. Agree with appt with me today.

## 2023-12-30 NOTE — Assessment & Plan Note (Addendum)
 Acute, concern for intra-abdominal inflammation given rebound and guarding on abdominal exam.  Pain predominant on left lower quadrant but continues across suprapubic and right lower quadrant region. Will evaluate with urinalysis, CBC, complete metabolic panel. Given patient with history of diverticulosis I have a high level concern for acute diverticulitis.  Less likely ovarian, uterine pathology.  Appendicitis on the differential but not clearly associated given primary pain is in left lower quadrant and patient without vomiting or fever.  Patient does have history of irritable bowel syndrome but no current change in bowel movements.  Symptoms could possibly be from pain predominant IBS.  Will send for acute CT abdomen and pelvis to rule out diverticulitis.  Routine and ER precautions provided.  Patient instructed to go to ER if fever, unable to keep down liquids or severe abdominal pain.

## 2023-12-30 NOTE — Telephone Encounter (Signed)
 FYI Only or Action Required?: FYI only for provider.  Patient was last seen in primary care on 11/18/2023 by Avelina Greig BRAVO, MD.  Called Nurse Triage reporting Abdominal Pain.  Symptoms began several days ago.  Interventions attempted: Nothing.  Symptoms are: gradually worsening.  Triage Disposition: See HCP Within 4 Hours (Or PCP Triage)  Patient/caregiver understands and will follow disposition?: Yes    Copied from CRM [id number]. Topic: Clinical - Red Word Triage >> Dec 30, 2023  8:16 AM Rosina BIRCH wrote: Red Word that prompted transfer to Nurse Triage: abdominal pain for two days Reason for Disposition  [1] MILD-MODERATE pain AND [2] constant AND [3] age > 60 years  Answer Assessment - Initial Assessment Questions 1. LOCATION: Where does it hurt?      Below the Umbilicus  2. RADIATION: Does the pain shoot anywhere else? (e.g., chest, back)     Denies  3. ONSET: When did the pain begin? (e.g., minutes, hours or days ago)      On Tuesday  4. SUDDEN: Gradual or sudden onset?     Sudden  5. PATTERN Does the pain come and go, or is it constant?     Constant, Dull  6. SEVERITY: How bad is the pain?  (e.g., Scale 1-10; mild, moderate, or severe)     4  7. RECURRENT SYMPTOM: Have you ever had this type of stomach pain before? If Yes, ask: When was the last time? and What happened that time?      Yes, Irritable Bowel Syndrome Exacerbation  8. CAUSE: What do you think is causing the stomach pain? (e.g., gallstones, recent abdominal surgery)     Unsure   9. RELIEVING/AGGRAVATING FACTORS: What makes it better or worse? (e.g., antacids, bending or twisting motion, bowel movement)     Denies both  10. OTHER SYMPTOMS: Do you have any other symptoms? (e.g., back pain, diarrhea, fever, urination pain, vomiting)       Denies any other symptoms  11. PREGNANCY: Is there any chance you are pregnant? When was your last menstrual period?       No and  No  Protocols used: Abdominal Pain - Female-A-AH

## 2024-01-04 ENCOUNTER — Encounter: Payer: Self-pay | Admitting: Family Medicine

## 2024-01-06 MED ORDER — FLUCONAZOLE 150 MG PO TABS: ORAL_TABLET | ORAL | 0 refills | Status: AC

## 2024-01-07 ENCOUNTER — Other Ambulatory Visit: Payer: Self-pay | Admitting: Family Medicine

## 2024-03-27 ENCOUNTER — Other Ambulatory Visit: Payer: Self-pay | Admitting: Family Medicine

## 2024-03-27 ENCOUNTER — Encounter

## 2024-03-27 DIAGNOSIS — Z1231 Encounter for screening mammogram for malignant neoplasm of breast: Secondary | ICD-10-CM

## 2024-04-17 ENCOUNTER — Encounter: Payer: Self-pay | Admitting: Family Medicine

## 2024-04-19 MED ORDER — DICYCLOMINE HCL 10 MG PO CAPS: 10.0000 mg | ORAL_CAPSULE | Freq: Three times a day (TID) | ORAL | 1 refills | Status: AC

## 2024-05-10 ENCOUNTER — Encounter: Payer: Self-pay | Admitting: Family Medicine

## 2024-05-15 ENCOUNTER — Ambulatory Visit: Payer: Self-pay

## 2024-05-15 NOTE — Telephone Encounter (Signed)
 FYI Only or Action Required?: Action required by provider: request for appointment.  Patient was last seen in primary care on 12/30/2023 by Avelina Greig BRAVO, MD.  Called Nurse Triage reporting Fever.  Symptoms began several days ago.  Interventions attempted: OTC medications: tylenol .  Symptoms are: unchanged.  Triage Disposition: See HCP Within 4 Hours (Or PCP Triage)  Patient/caregiver understands and will follow disposition?: No, wishes to speak with PCP   Copied from CRM #[id number]. Topic: Clinical - Red Word Triage >> May 15, 2024  8:14 AM Suzen RAMAN wrote: Red Word that prompted transfer to Nurse Triage: fever all weekend 103, sore throat and productive cough. Requesting an appt Reason for Disposition  [1] Fever > 101 F (38.3 C) AND [2] age > 60 years  Answer Assessment - Initial Assessment Questions No available appts today. Advised UC now and ED if symptoms worsen.  Patient requesting video visit.  Provided [facility].  1. TEMPERATURE: What is the most recent temperature?  How was it measured?      101 temp, morning, took tylenol  103 temp; highest last night 2. ONSET: When did the fever start?      Saturday 3. CHILLS: Do you have chills? If yes: How bad are they?  (e.g., none, mild, moderate, severe)     None; moderate when occurs 4. OTHER SYMPTOMS: Do you have any other symptoms besides the fever?  (e.g., abdomen pain, cough, diarrhea, earache, headache, sore throat, urination pain)     Cough; productive; thick green, runny nose; clear, HA, intermittent dizziness with standing 5. CAUSE: If there are no symptoms, ask: What do you think is causing the fever?      Sick children 6. CONTACTS: Does anyone else in the family have an infection?     Sick children 7. TREATMENT: What have you done so far to treat this fever? (e.g., OTC fever medicines)     tylenol  8. IMMUNOCOMPROMISE: Do you have any of the following: diabetes, HIV  positive, splenectomy, cancer chemotherapy, chronic steroid treatment, transplant patient, etc.?     no Flare up of IBS-d x1, denies diff breathing, chest pain, n/v/ d, sore throat  Protocols used: Fever-A-AH

## 2024-05-15 NOTE — Telephone Encounter (Signed)
 Spoke with Natalie Hudson.  She really does not want to go to UC.  I have scheduled her to see Dr. Bennett tomorrow 12.23.2025 at 4:00 pm.  She will alternate Tylenol  and Ibuprofen  every 3 hours for fever.  She will go to UC today if symptoms get worse.  FYI to Dr. Bennett.

## 2024-05-16 ENCOUNTER — Ambulatory Visit

## 2024-05-16 ENCOUNTER — Ambulatory Visit
Admission: RE | Admit: 2024-05-16 | Discharge: 2024-05-16 | Disposition: A | Discharge status: Home / Self Care | Source: Ambulatory Visit

## 2024-05-16 VITALS — BP 110/80 | HR 113 | Temp 99.0°F | Ht 59.0 in | Wt 125.0 lb

## 2024-05-16 DIAGNOSIS — R051 Acute cough: Secondary | ICD-10-CM

## 2024-05-16 LAB — POC COVID19 BINAXNOW: SARS Coronavirus 2 Ag: NEGATIVE

## 2024-05-16 LAB — POCT INFLUENZA A/B
Influenza A, POC: NEGATIVE
Influenza B, POC: NEGATIVE

## 2024-05-16 MED ORDER — BENZONATATE 200 MG PO CAPS: 200.0000 mg | ORAL_CAPSULE | Freq: Three times a day (TID) | ORAL | 0 refills | Status: AC | PRN

## 2024-05-16 MED ORDER — PROMETHAZINE-DM 6.25-15 MG/5ML PO SYRP: 5.0000 mL | ORAL_SOLUTION | Freq: Every evening | ORAL | 0 refills | Status: AC

## 2024-05-16 MED ORDER — IBUPROFEN 600 MG PO TABS: 600.0000 mg | ORAL_TABLET | Freq: Three times a day (TID) | ORAL | 0 refills | Status: AC | PRN

## 2024-05-16 NOTE — Patient Instructions (Signed)
 Thank you for visiting [facility] today! Here's what we talked about: - START tessalon  during the day, then Promethazine  syrup at night - Alternate between Tylenol  and 600mg  Advil  every 8 hours (take one then alternate with the other 4hrs later) -See PCP if unimproved in 7 days

## 2024-05-16 NOTE — Progress Notes (Signed)
 "  Subjective:   This visit was conducted in person. The patient gave informed consent to the use of Abridge AI technology to record the contents of the encounter as documented below.   Patient ID: Natalie Hudson, female    DOB: 08-14-1960, 63 y.o.   MRN: [id number]   Discussed the use of AI scribe software for clinical note transcription with the patient, who gave verbal consent to proceed.  History of Present Illness Natalie Hudson is a 63 year old female who presents with fever, chills, cough, and headache.  She has been experiencing fever, chills, nasal congestion, headache, and cough since May 13, 2024. Her fever has been persistent, typically ranging from 100F to 101F during the day, peaking at 102F, and reaching 103F the night before.  She describes body aches, particularly soreness from coughing, and occasional difficulty in taking a deep breath. No consistent shortness of breath, but sometimes it is hard to get a deep breath. She has a productive cough with thick green sputum and a sore throat, which she attributes to frequent coughing. No chest pain.  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 For symptom management, she has been alternating between Tylenol  and ibuprofen  for fever, taking two extra-strength Tylenol  and two ibuprofen  tablets. She started alternating these medications yesterday, taking them every four hours, with Tylenol  every eight hours. She checks her temperature before taking medication.  The cough is the most bothersome symptom, as it disrupts her sleep, causing restlessness and difficulty sleeping. She tries to stay active but tires easily and needs to rest frequently.  Her social history includes living with her daughter, and she has isolated herself to prevent further spread of illness. She mentions a family history of salivary cancer in her father, which led to a  previous ultrasound of her neck that was normal.   Review of Systems  All other systems reviewed and are negative.       Allergies[1]  Medications Ordered Prior to Encounter[2]  BP 110/80 (BP Location: Left Arm, Patient Position: Sitting, Cuff Size: Normal)   Pulse (!) 113   Temp 99 F (37.2 C) (Oral)   Ht 4' 11 (1.499 m)   Wt 125 lb (56.7 kg)   SpO2 96%   BMI 25.25 kg/m   Objective:      Physical Exam  GENERAL: Alert, cooperative, well developed, no acute distress. HEAD: Normocephalic atraumatic. EYES: Extraocular movements intact BL, pupils round, equal and reactive to light BL, conjunctivae normal BL.. THROAT: No oropharyngeal exudate or posterior oropharyngeal erythema. No frontal or maxillary sinus tenderness bilaterally. Throat normal. CARDIOVASCULAR: Normal heart rate and rhythm, S1 and S2 normal without murmurs. CHEST: Clear to auscultation bilaterally, no wheezes, rhonchi, or crackles. NECK: Mild cervical lymphadenopathy.         Assessment & Plan:    Assessment & Plan Acute viral upper respiratory infection Symptoms consistent with viral infection. Negative for COVID-19 and influenza. RSV suspected. Lungs clear. Cough most bothersome. CXR not convincing for consolidation, managing conservatively  - Prescribed promethazine  syrup 5 mL at night for cough and sleep. - Prescribed Tessalon  Perles 1 capsule every 8 hours during the day for cough. - Continue alternating acetaminophen  and ibuprofen  for fever and pain, with ibuprofen  increased to 600 mg every 8 hours as needed. - Ordered RSV test. - Advised to avoid nasal sprays due to risk of epistaxis. - Instructed to follow  up with primary care provider if symptoms do not improve in 7 days.    No follow-ups on file.   Shemicka Cohrs K Buelah Rennie, MD  05/16/2024     Contains text generated by Abridge.         [1]  Allergies Allergen Reactions   Escitalopram Oxalate     REACTION: nausea 10 mg   Sertraline  Hcl     REACTION: sluggish  [2]  Current Outpatient Medications on File Prior to Visit  Medication Sig Dispense Refill   acetaminophen  (TYLENOL ) 500 MG tablet Take 2 tablets (1,000 mg total) by mouth every 6 (six) hours as needed. 30 tablet 0   Ascorbic Acid (VITAMIN C WITH ROSE HIPS) 500 MG tablet Take 500 mg by mouth daily.     atorvastatin  (LIPITOR) 10 MG tablet Take 0.5 tablets (5 mg total) by mouth 3 (three) times a week. 20 tablet 3   Biotin 5000 MCG CAPS Take 5,000 mcg by mouth daily.     Calcium  Carbonate-Simethicone (ALKA-SELTZER HEARTBURN + GAS) 750-80 MG CHEW Chew 1 Application by mouth daily as needed (for gas or heartburn).     Cholecalciferol (VITAMIN D3) 25 MCG (1000 UT) CAPS Take 1 capsule by mouth daily.     cyanocobalamin  1000 MCG tablet Take 1,000 mcg by mouth daily.     dicyclomine  (BENTYL ) 10 MG capsule Take 1 capsule (10 mg total) by mouth 4 (four) times daily -  before meals and at bedtime. 90 capsule 1   fluconazole  (DIFLUCAN ) 150 MG tablet Take 1 tab po today, repeat in 3 days if needed 2 tablet 0   glipiZIDE  (GLUCOTROL  XL) 2.5 MG 24 hr tablet TAKE ONE TABLET BY MOUTH ONCE A DAY WITH BREAKFAST 90 tablet 1   hydrOXYzine  (ATARAX ) 10 MG tablet Take 1 tablet (10 mg total) by mouth 3 (three) times daily as needed for anxiety. 30 tablet 0   No current facility-administered medications on file prior to visit.   "

## 2024-05-17 LAB — RSV SCREEN (NASOPHARYNGEAL) NOT AT ARMC
MICRO NUMBER:: 17392061
RESULT:: NOT DETECTED
SPECIMEN QUALITY:: ADEQUATE

## 2024-05-22 ENCOUNTER — Ambulatory Visit: Payer: Self-pay

## 2024-06-07 ENCOUNTER — Other Ambulatory Visit: Payer: Self-pay | Admitting: Family Medicine

## 2024-06-07 DIAGNOSIS — E1169 Type 2 diabetes mellitus with other specified complication: Secondary | ICD-10-CM

## 2024-06-07 NOTE — Telephone Encounter (Signed)
 Please schedule diabetes follow up with fasting labs prior prior for Dr. Avelina.

## 2024-06-12 ENCOUNTER — Ambulatory Visit
Admission: RE | Admit: 2024-06-12 | Discharge: 2024-06-12 | Disposition: A | Source: Ambulatory Visit | Attending: Family Medicine | Admitting: Family Medicine

## 2024-06-12 DIAGNOSIS — Z1231 Encounter for screening mammogram for malignant neoplasm of breast: Secondary | ICD-10-CM | POA: Diagnosis present

## 2024-06-15 ENCOUNTER — Ambulatory Visit: Payer: Self-pay | Admitting: Family Medicine

## 2024-06-16 ENCOUNTER — Other Ambulatory Visit

## 2024-06-16 ENCOUNTER — Ambulatory Visit: Payer: Self-pay | Admitting: Family Medicine

## 2024-06-16 DIAGNOSIS — E785 Hyperlipidemia, unspecified: Secondary | ICD-10-CM

## 2024-06-16 DIAGNOSIS — E1169 Type 2 diabetes mellitus with other specified complication: Secondary | ICD-10-CM | POA: Diagnosis not present

## 2024-06-16 LAB — LIPID PANEL
Cholesterol: 155 mg/dL (ref 28–200)
HDL: 59.4 mg/dL
LDL Cholesterol: 81 mg/dL (ref 10–99)
NonHDL: 95.9
Total CHOL/HDL Ratio: 3
Triglycerides: 73 mg/dL (ref 10.0–149.0)
VLDL: 14.6 mg/dL (ref 0.0–40.0)

## 2024-06-16 LAB — COMPREHENSIVE METABOLIC PANEL WITH GFR
ALT: 12 U/L (ref 3–35)
AST: 13 U/L (ref 5–37)
Albumin: 4.1 g/dL (ref 3.5–5.2)
Alkaline Phosphatase: 63 U/L (ref 39–117)
BUN: 12 mg/dL (ref 6–23)
CO2: 29 meq/L (ref 19–32)
Calcium: 9.1 mg/dL (ref 8.4–10.5)
Chloride: 102 meq/L (ref 96–112)
Creatinine, Ser: 0.63 mg/dL (ref 0.40–1.20)
GFR: 93.63 mL/min
Glucose, Bld: 184 mg/dL — ABNORMAL HIGH (ref 70–99)
Potassium: 4.3 meq/L (ref 3.5–5.1)
Sodium: 138 meq/L (ref 135–145)
Total Bilirubin: 0.4 mg/dL (ref 0.2–1.2)
Total Protein: 7.4 g/dL (ref 6.0–8.3)

## 2024-06-16 LAB — HEMOGLOBIN A1C: Hgb A1c MFr Bld: 7.9 % — ABNORMAL HIGH (ref 4.6–6.5)

## 2024-06-16 NOTE — Progress Notes (Signed)
 No critical labs need to be addressed urgently. We will discuss labs in detail at upcoming office visit.

## 2024-06-22 ENCOUNTER — Encounter: Payer: Self-pay | Admitting: Family Medicine

## 2024-06-22 ENCOUNTER — Ambulatory Visit: Admitting: Family Medicine

## 2024-06-22 VITALS — BP 100/74 | HR 92 | Temp 98.7°F | Ht 59.0 in | Wt 124.5 lb

## 2024-06-22 DIAGNOSIS — T466X5A Adverse effect of antihyperlipidemic and antiarteriosclerotic drugs, initial encounter: Secondary | ICD-10-CM | POA: Diagnosis not present

## 2024-06-22 DIAGNOSIS — E1169 Type 2 diabetes mellitus with other specified complication: Secondary | ICD-10-CM | POA: Diagnosis not present

## 2024-06-22 DIAGNOSIS — M791 Myalgia, unspecified site: Secondary | ICD-10-CM

## 2024-06-22 DIAGNOSIS — R058 Other specified cough: Secondary | ICD-10-CM | POA: Diagnosis not present

## 2024-06-22 DIAGNOSIS — E785 Hyperlipidemia, unspecified: Secondary | ICD-10-CM | POA: Diagnosis not present

## 2024-06-22 DIAGNOSIS — Z7984 Long term (current) use of oral hypoglycemic drugs: Secondary | ICD-10-CM

## 2024-06-22 DIAGNOSIS — L03032 Cellulitis of left toe: Secondary | ICD-10-CM

## 2024-06-22 LAB — HM DIABETES FOOT EXAM

## 2024-06-22 MED ORDER — ALBUTEROL SULFATE HFA 108 (90 BASE) MCG/ACT IN AERS: 2.0000 | INHALATION_SPRAY | Freq: Four times a day (QID) | RESPIRATORY_TRACT | 0 refills | Status: AC | PRN

## 2024-06-22 MED ORDER — GLIPIZIDE ER 5 MG PO TB24: 5.0000 mg | ORAL_TABLET | Freq: Every day | ORAL | 11 refills | Status: AC

## 2024-06-22 MED ORDER — HYDROXYZINE HCL 10 MG PO TABS: 10.0000 mg | ORAL_TABLET | Freq: Three times a day (TID) | ORAL | 0 refills | Status: DC | PRN

## 2024-06-22 NOTE — Patient Instructions (Signed)
"   Warm compresses/soak with topical antibacterial ointment .SABRA If not improving follow up. "

## 2024-06-22 NOTE — Assessment & Plan Note (Signed)
"   Cannot take any dose of statin .SABRA Causes SE. "

## 2024-06-22 NOTE — Assessment & Plan Note (Signed)
"   Retrial of statin.. intolerant at even low dose.  Will work on lifestly changes.  LDL at goal. "

## 2024-06-22 NOTE — Progress Notes (Signed)
 "   Patient ID: Natalie Hudson, female    DOB: 10-22-1960, 64 y.o.   MRN: [id number]  This visit was conducted in person.  Blood pressure 100/70, pulse 77, temperature 97.8 F (36.6 C), temperature source Temporal, height 4' 11 (1.499 m), weight 128 lb 8 oz (58.3 kg), SpO2 99%.  CC: Chief Complaint  Patient presents with   Diabetes    Subjective:   HPI: Natalie Hudson is a 64 y.o. female presenting on 06/22/2024 for Diabetes  Diabetes:  worsened control on glipizide  2.5 mg XL daily .Natalie Hudson Has missed a couple days of med.  Moderate diet control.. she has not been walking and not following low carb diet.  Has also had the flu  like illness in the last month.  Still some cough , but not keeping her up at night. Lab Results  Component Value Date   HGBA1C 7.9 (H) 06/16/2024  Using medications without difficulties: Hypoglycemic episodes:none Hyperglycemic episodes:yes Feet problems: no ulcers Blood Sugars averaging: CBGs  170s She  will look into [facility] eye exam within last year:  yes  She is not taking atorvastatin  for cholesterol.. had dizziness SE even on the lower dose. Tried it for 3 weeks and it id not resolve  Improved control. Lab Results  Component Value Date   CHOL 155 06/16/2024   HDL 59.40 06/16/2024   LDLCALC 81 06/16/2024   TRIG 73.0 06/16/2024   CHOLHDL 3 06/16/2024   Needs refill of atarax  for intermittent GAD.Natalie Hudson some  increase in episodes in the last several   Relevant past medical, surgical, family and social history reviewed and updated as indicated. Interim medical history since our last visit reviewed. Allergies and medications reviewed and updated. Outpatient Medications Prior to Visit  Medication Sig Dispense Refill   acetaminophen  (TYLENOL ) 500 MG tablet Take 2 tablets (1,000 mg total) by mouth every 6 (six) hours as needed. 30 tablet 0   Ascorbic Acid (VITAMIN C WITH ROSE HIPS) 500 MG tablet Take 500 mg by mouth daily.     Biotin 5000 MCG CAPS  Take 5,000 mcg by mouth daily.     Calcium  Carbonate-Simethicone (ALKA-SELTZER HEARTBURN + GAS) 750-80 MG CHEW Chew 1 Application by mouth daily as needed (for gas or heartburn).     Cholecalciferol (VITAMIN D3) 25 MCG (1000 UT) CAPS Take 1 capsule by mouth daily.     cyanocobalamin  1000 MCG tablet Take 1,000 mcg by mouth daily.     dicyclomine  (BENTYL ) 10 MG capsule Take 1 capsule (10 mg total) by mouth 4 (four) times daily -  before meals and at bedtime. 90 capsule 1   fluconazole  (DIFLUCAN ) 150 MG tablet Take 1 tab po today, repeat in 3 days if needed 2 tablet 0   glipiZIDE  (GLUCOTROL  XL) 2.5 MG 24 hr tablet TAKE ONE TABLET BY MOUTH ONCE A DAY WITH BREAKFAST 90 tablet 0   hydrOXYzine  (ATARAX ) 10 MG tablet Take 1 tablet (10 mg total) by mouth 3 (three) times daily as needed for anxiety. 30 tablet 0   atorvastatin  (LIPITOR) 10 MG tablet Take 0.5 tablets (5 mg total) by mouth 3 (three) times a week. 20 tablet 3   No facility-administered medications prior to visit.     Per HPI unless specifically indicated in ROS section below Review of Systems  Constitutional:  Negative for fatigue and fever.  HENT:  Negative for congestion.   Eyes:  Negative for pain.  Respiratory:  Negative for cough and shortness of breath.  Cardiovascular:  Negative for chest pain, palpitations and leg swelling.  Gastrointestinal:  Negative for abdominal pain.  Genitourinary:  Negative for dysuria and vaginal bleeding.  Musculoskeletal:  Negative for back pain.  Neurological:  Negative for syncope, light-headedness and headaches.  Psychiatric/Behavioral:  Negative for dysphoric mood.    Objective:  BP 100/74   Pulse 92   Temp 98.7 F (37.1 C) (Temporal)   Ht 4' 11 (1.499 m)   Wt 124 lb 8 oz (56.5 kg)   SpO2 98%   BMI 25.15 kg/m   Wt Readings from Last 3 Encounters:  06/22/24 124 lb 8 oz (56.5 kg)  05/16/24 125 lb (56.7 kg)  12/30/23 127 lb 2 oz (57.7 kg)      Physical Exam Constitutional:       General: She is not in acute distress.    Appearance: Normal appearance. She is well-developed. She is not ill-appearing or toxic-appearing.  HENT:     Head: Normocephalic.     Right Ear: Hearing, tympanic membrane, ear canal and external ear normal.     Left Ear: Hearing, tympanic membrane, ear canal and external ear normal.     Nose: Nose normal.  Eyes:     General: Lids are normal. Lids are everted, no foreign bodies appreciated.     Conjunctiva/sclera: Conjunctivae normal.     Pupils: Pupils are equal, round, and reactive to light.  Neck:     Thyroid : No thyroid  mass or thyromegaly.     Vascular: No carotid bruit.     Trachea: Trachea normal.  Cardiovascular:     Rate and Rhythm: Normal rate and regular rhythm.     Heart sounds: Normal heart sounds, S1 normal and S2 normal. No murmur heard.    No gallop.  Pulmonary:     Effort: Pulmonary effort is normal. No respiratory distress.     Breath sounds: Normal breath sounds. No wheezing, rhonchi or rales.  Abdominal:     General: Bowel sounds are normal. There is no distension or abdominal bruit.     Palpations: Abdomen is soft. There is no fluid wave or mass.     Tenderness: There is no abdominal tenderness. There is no guarding or rebound.     Hernia: No hernia is present.  Genitourinary:    Exam position: Supine.     Labia:        Right: No rash, tenderness or lesion.        Left: No rash, tenderness or lesion.      Vagina: Normal.     Cervix: No cervical motion tenderness, discharge or friability.     Uterus: Not enlarged and not tender.      Adnexa:        Right: No mass, tenderness or fullness.         Left: No mass, tenderness or fullness.    Musculoskeletal:     Cervical back: Normal range of motion and neck supple.  Lymphadenopathy:     Cervical: Cervical adenopathy present.     Right cervical: No superficial, deep or posterior cervical adenopathy.    Left cervical: Superficial cervical adenopathy present. No deep  or posterior cervical adenopathy.     Upper Body:     Right upper body: No supraclavicular, axillary, pectoral or epitrochlear adenopathy.     Left upper body: No supraclavicular, axillary, pectoral or epitrochlear adenopathy.  Skin:    General: Skin is warm and dry.     Findings: No rash.  Neurological:     Mental Status: She is alert.     Cranial Nerves: No cranial nerve deficit.     Sensory: No sensory deficit.  Psychiatric:        Mood and Affect: Mood is not anxious or depressed.        Speech: Speech normal.        Behavior: Behavior normal. Behavior is cooperative.        Judgment: Judgment normal.    Diabetic foot exam: Normal inspection except under  left great toenail with discharge and occ bledding... past injury years ago No skin breakdown No calluses  Normal DP pulses Normal sensation to light touch and monofilament Nails normal except , left great toenail, pale.     Results for orders placed or performed in visit on 06/22/24  [facility] DIABETES FOOT EXAM   Collection Time: 06/22/24 12:00 AM  Result Value Ref Range   [facility] Diabetic Foot Exam done     This visit occurred during the SARS-CoV-2 public health emergency.  Safety protocols were in place, including screening questions prior to the visit, additional usage of staff PPE, and extensive cleaning of exam room while observing appropriate contact time as indicated for disinfecting solutions.   COVID 19 screen:  No recent travel or known exposure to COVID19 The patient denies respiratory symptoms of COVID 19 at this time. The importance of social distancing was discussed today.   Assessment and Plan  Problem List Items Addressed This Visit     Abscess of great toenail, left    Chronic, intermittent, paronychia type issue.. no pain... will treat with warm compresses/soak with topical antibacterial ointment .Natalie Hudson If not improving follow up.      Hyperlipidemia associated with type 2 diabetes mellitus (HCC)    Retrial  of statin.. intolerant at even low dose.  Will work on lifestly changes.  LDL at goal.      Relevant Medications   glipiZIDE  (GLUCOTROL  XL) 5 MG 24 hr tablet   Myalgia due to statin    Cannot take any dose of statin .Natalie Hudson Causes SE.      Post-viral cough syndrome    Acute no sign of bacterial infecitons.  Treat with albuterol  prn coughing fits.      Type 2 diabetes mellitus with other specified complication (HCC) - Primary    Chronic, worsening control... worsened diet with illness and holidays, no exercsie.  Plans to restart. Increase glipizide  XL to 5 mg daily... can reduce if improving lifestyle changes.  Re-eval in 3 months.       Relevant Medications   glipiZIDE  (GLUCOTROL  XL) 5 MG 24 hr tablet      Greig Ring, MD   "

## 2024-06-22 NOTE — Assessment & Plan Note (Signed)
"   Acute no sign of bacterial infecitons.  Treat with albuterol  prn coughing fits. "

## 2024-06-22 NOTE — Assessment & Plan Note (Addendum)
"   Chronic, intermittent, paronychia type issue.. no pain... will treat with warm compresses/soak with topical antibacterial ointment .SABRA If not improving follow up. "

## 2024-06-22 NOTE — Assessment & Plan Note (Signed)
"   Chronic, worsening control... worsened diet with illness and holidays, no exercsie.  Plans to restart. Increase glipizide  XL to 5 mg daily... can reduce if improving lifestyle changes.  Re-eval in 3 months.  "

## 2024-06-29 ENCOUNTER — Other Ambulatory Visit: Payer: Self-pay | Admitting: Family Medicine

## 2024-06-29 NOTE — Telephone Encounter (Signed)
 Last office visit 06/22/2024 for DM.  Last refilled 06/22/2024 for #30 with no refills. Next Appt: 09/21/24 for DM.

## 2024-09-21 ENCOUNTER — Ambulatory Visit: Admitting: Family Medicine
